# Patient Record
Sex: Male | Born: 1940 | Race: White | Hispanic: No | Marital: Married | Smoking: Never smoker
Health system: Southern US, Community
[De-identification: ages and names within clinical notes are randomized; demographics above are authoritative.]

## PROBLEM LIST (undated history)

## (undated) DIAGNOSIS — G5602 Carpal tunnel syndrome, left upper limb: Secondary | ICD-10-CM

## (undated) DIAGNOSIS — R351 Nocturia: Secondary | ICD-10-CM

## (undated) DIAGNOSIS — R42 Dizziness and giddiness: Secondary | ICD-10-CM

## (undated) DIAGNOSIS — I10 Essential (primary) hypertension: Secondary | ICD-10-CM

## (undated) DIAGNOSIS — M1990 Unspecified osteoarthritis, unspecified site: Secondary | ICD-10-CM

## (undated) DIAGNOSIS — Z22322 Carrier or suspected carrier of Methicillin resistant Staphylococcus aureus: Secondary | ICD-10-CM

## (undated) DIAGNOSIS — M545 Low back pain, unspecified: Secondary | ICD-10-CM

## (undated) DIAGNOSIS — G629 Polyneuropathy, unspecified: Secondary | ICD-10-CM

## (undated) HISTORY — PX: TONSILLECTOMY: SUR1361

## (undated) HISTORY — PX: JOINT REPLACEMENT: SHX530

---
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
## 2009-01-21 DEATH — deceased

## 2010-02-21 HISTORY — PX: OTHER SURGICAL HISTORY: SHX169

## 2012-01-31 ENCOUNTER — Other Ambulatory Visit (HOSPITAL_COMMUNITY): Payer: Self-pay | Admitting: Orthopaedic Surgery

## 2012-02-01 ENCOUNTER — Encounter (HOSPITAL_COMMUNITY): Payer: Self-pay | Admitting: Pharmacy Technician

## 2012-02-07 ENCOUNTER — Encounter (HOSPITAL_COMMUNITY)
Admission: RE | Admit: 2012-02-07 | Discharge: 2012-02-07 | Disposition: A | Discharge status: Home / Self Care | Payer: Medicare Other | Source: Ambulatory Visit | Attending: Orthopaedic Surgery | Admitting: Orthopaedic Surgery

## 2012-02-07 ENCOUNTER — Ambulatory Visit (HOSPITAL_COMMUNITY)
Admission: RE | Admit: 2012-02-07 | Discharge: 2012-02-07 | Disposition: A | Discharge status: Home / Self Care | Payer: Medicare Other | Source: Ambulatory Visit | Attending: Orthopaedic Surgery | Admitting: Orthopaedic Surgery

## 2012-02-07 ENCOUNTER — Encounter (HOSPITAL_COMMUNITY): Payer: Self-pay

## 2012-02-07 DIAGNOSIS — Z01818 Encounter for other preprocedural examination: Secondary | ICD-10-CM | POA: Insufficient documentation

## 2012-02-07 DIAGNOSIS — Z01812 Encounter for preprocedural laboratory examination: Secondary | ICD-10-CM | POA: Insufficient documentation

## 2012-02-07 DIAGNOSIS — I10 Essential (primary) hypertension: Secondary | ICD-10-CM | POA: Insufficient documentation

## 2012-02-07 HISTORY — DX: Essential (primary) hypertension: I10

## 2012-02-07 HISTORY — DX: Unspecified osteoarthritis, unspecified site: M19.90

## 2012-02-07 HISTORY — DX: Nocturia: R35.1

## 2012-02-07 LAB — CBC
HCT: 41.5 % (ref 39.0–52.0)
MCH: 33.4 pg (ref 26.0–34.0)
MCV: 97 fL (ref 78.0–100.0)
Platelets: 231 10*3/uL (ref 150–400)
RBC: 4.28 MIL/uL (ref 4.22–5.81)
WBC: 7.4 10*3/uL (ref 4.0–10.5)

## 2012-02-07 LAB — SURGICAL PCR SCREEN
MRSA, PCR: NEGATIVE
Staphylococcus aureus: NEGATIVE

## 2012-02-07 LAB — URINALYSIS, ROUTINE W REFLEX MICROSCOPIC
Bilirubin Urine: NEGATIVE
Hgb urine dipstick: NEGATIVE
Ketones, ur: NEGATIVE mg/dL
Nitrite: NEGATIVE
Specific Gravity, Urine: 1.025 (ref 1.005–1.030)
pH: 5.5 (ref 5.0–8.0)

## 2012-02-07 LAB — BASIC METABOLIC PANEL
CO2: 28 mEq/L (ref 19–32)
Calcium: 9.7 mg/dL (ref 8.4–10.5)
Chloride: 99 mEq/L (ref 96–112)
Creatinine, Ser: 0.94 mg/dL (ref 0.50–1.35)
Glucose, Bld: 95 mg/dL (ref 70–99)
Sodium: 136 mEq/L (ref 135–145)

## 2012-02-07 NOTE — Progress Notes (Signed)
02/07/12 1100  OBSTRUCTIVE SLEEP APNEA  Have you ever been diagnosed with sleep apnea through a sleep study? No  Do you snore loudly (loud enough to be heard through closed doors)?  1  Do you often feel tired, fatigued, or sleepy during the daytime? 1  Has anyone observed you stop breathing during your sleep? 1  Do you have, or are you being treated for high blood pressure? 1  BMI more than 35 kg/m2? 1  Age over 70 years old? 1  Neck circumference greater than 40 cm/18 inches? 1  Gender: 1  Obstructive Sleep Apnea Score 8   Score 4 or greater  Results sent to PCP

## 2012-02-07 NOTE — Pre-Procedure Instructions (Signed)
PREOP CBC, BMET, PT, PTT, UA, EKG, CXR WERE DONE TODAY AT [facility] AS PER ORDERS DR. Maureen Ralphs AND ANESTHESIOLOGIST'S GUIDELINES.  T/S TO BE DONE DAY OF SURGERY.

## 2012-02-07 NOTE — Patient Instructions (Addendum)
YOUR SURGERY IS SCHEDULED AT [facility]  ON:  Thursday  12/26  REPORT TO [place] [facility] AT: 11:00 AM      PHONE # FOR SHORT STAY IS [phone]  DO NOT EAT ANYTHING AFTER MIDNIGHT THE NIGHT BEFORE YOUR SURGERY.   NO FOOD, NO CHEWING GUM, NO MINTS, NO CANDIES, NO CHEWING TOBACCO. YOU MAY HAVE CLEAR LIQUIDS TO DRINK FROM MIDNIGHT THE NIGHT BEFORE SURGERY - UNTIL 7:00 AM DAY OF SURGERY - LIKE WATER, BLACK COFFEE - NO MILK OR MILK PRODUCTS.    NOTHING TO DRINK AFTER 7:00 AM DAY OF SURGERY!!!!  PLEASE TAKE THE FOLLOWING MEDICATIONS THE AM OF YOUR SURGERY WITH A FEW SIPS OF WATER:  CARDIZEM  IF YOU USE INHALERS--USE YOUR INHALERS THE AM OF YOUR SURGERY AND BRING INHALERS TO THE HOSPITAL -TAKE TO SURGERY.    IF YOU ARE DIABETIC:  DO NOT TAKE ANY DIABETIC MEDICATIONS THE AM OF YOUR SURGERY.  IF YOU TAKE INSULIN IN THE EVENINGS--PLEASE ONLY TAKE 1/2 NORMAL EVENING DOSE THE NIGHT BEFORE YOUR SURGERY.  NO INSULIN THE AM OF YOUR SURGERY.  IF YOU HAVE SLEEP APNEA AND USE CPAP OR BIPAP--PLEASE BRING THE MASK AND THE TUBING.  DO NOT BRING YOUR MACHINE.  DO NOT BRING VALUABLES, MONEY, CREDIT CARDS.  DO NOT WEAR JEWELRY, MAKE-UP, NAIL POLISH AND NO METAL PINS OR CLIPS IN YOUR HAIR. CONTACT LENS, DENTURES / PARTIALS, GLASSES SHOULD NOT BE WORN TO SURGERY AND IN MOST CASES-HEARING AIDS WILL NEED TO BE REMOVED.  BRING YOUR GLASSES CASE, ANY EQUIPMENT NEEDED FOR YOUR CONTACT LENS. FOR PATIENTS ADMITTED TO THE HOSPITAL--CHECK OUT TIME THE DAY OF DISCHARGE IS 11:00 AM.  ALL INPATIENT ROOMS ARE PRIVATE - WITH BATHROOM, TELEPHONE, TELEVISION AND WIFI INTERNET.  IF YOU ARE BEING DISCHARGED THE SAME DAY OF YOUR SURGERY--YOU CAN NOT DRIVE YOURSELF HOME--AND SHOULD NOT GO HOME ALONE BY TAXI OR BUS.  NO DRIVING OR OPERATING MACHINERY FOR 24 HOURS FOLLOWING ANESTHESIA / PAIN MEDICATIONS.  PLEASE MAKE ARRANGEMENTS FOR SOMEONE TO BE WITH YOU AT HOME THE FIRST 24 HOURS AFTER SURGERY. RESPONSIBLE  DRIVER'S NAME___________________________                                               PHONE #   _______________________                               PLEASE READ OVER ANY  FACT SHEETS THAT YOU WERE GIVEN: MRSA INFORMATION, BLOOD TRANSFUSION INFORMATION, INCENTIVE SPIROMETER INFORMATION. FAILURE TO FOLLOW THESE INSTRUCTIONS MAY RESULT IN THE CANCELLATION OF YOUR SURGERY.   PATIENT SIGNATURE_________________________________

## 2012-02-16 ENCOUNTER — Inpatient Hospital Stay (HOSPITAL_COMMUNITY): Payer: Medicare Other

## 2012-02-16 ENCOUNTER — Inpatient Hospital Stay (HOSPITAL_COMMUNITY)
Admission: RE | Admit: 2012-02-16 | Discharge: 2012-02-18 | DRG: 470 | Disposition: A | Discharge status: Home Health | Payer: Medicare Other | Source: Ambulatory Visit | Attending: Orthopaedic Surgery | Admitting: Orthopaedic Surgery

## 2012-02-16 ENCOUNTER — Encounter (HOSPITAL_COMMUNITY): Payer: Self-pay | Admitting: Certified Registered Nurse Anesthetist

## 2012-02-16 ENCOUNTER — Encounter (HOSPITAL_COMMUNITY): Payer: Self-pay | Admitting: *Deleted

## 2012-02-16 ENCOUNTER — Inpatient Hospital Stay (HOSPITAL_COMMUNITY): Payer: Medicare Other | Admitting: Certified Registered Nurse Anesthetist

## 2012-02-16 ENCOUNTER — Encounter (HOSPITAL_COMMUNITY)
Admission: RE | Disposition: A | Discharge status: Home Health | Payer: Self-pay | Source: Ambulatory Visit | Attending: Orthopaedic Surgery

## 2012-02-16 DIAGNOSIS — I10 Essential (primary) hypertension: Secondary | ICD-10-CM | POA: Diagnosis present

## 2012-02-16 DIAGNOSIS — M1610 Unilateral primary osteoarthritis, unspecified hip: Principal | ICD-10-CM | POA: Diagnosis present

## 2012-02-16 DIAGNOSIS — M169 Osteoarthritis of hip, unspecified: Secondary | ICD-10-CM

## 2012-02-16 HISTORY — PX: TOTAL HIP ARTHROPLASTY: SHX124

## 2012-02-16 LAB — TYPE AND SCREEN: Antibody Screen: NEGATIVE

## 2012-02-16 LAB — ABO/RH: ABO/RH(D): O POS

## 2012-02-16 SURGERY — ARTHROPLASTY, HIP, TOTAL, ANTERIOR APPROACH
Anesthesia: General | Site: Hip | Laterality: Right | Wound class: Clean

## 2012-02-16 MED ORDER — HYDROMORPHONE HCL PF 1 MG/ML IJ SOLN: 1.0000 mg | INTRAMUSCULAR | Status: DC | PRN

## 2012-02-16 MED ORDER — PROPOFOL 10 MG/ML IV BOLUS
INTRAVENOUS | Status: DC | PRN
  Administered 2012-02-16: 180 mg via INTRAVENOUS

## 2012-02-16 MED ORDER — DOCUSATE SODIUM 100 MG PO CAPS
100.0000 mg | ORAL_CAPSULE | Freq: Two times a day (BID) | ORAL | Status: DC
  Administered 2012-02-16 – 2012-02-18 (×4): 100 mg via ORAL

## 2012-02-16 MED ORDER — LACTATED RINGERS IV SOLN
INTRAVENOUS | Status: DC | PRN
  Administered 2012-02-16 (×2): via INTRAVENOUS

## 2012-02-16 MED ORDER — ONDANSETRON HCL 4 MG/2ML IJ SOLN: 4.0000 mg | Freq: Four times a day (QID) | INTRAMUSCULAR | Status: DC | PRN

## 2012-02-16 MED ORDER — FENTANYL CITRATE 0.05 MG/ML IJ SOLN
INTRAMUSCULAR | Status: DC | PRN
  Administered 2012-02-16: 100 ug via INTRAVENOUS
  Administered 2012-02-16: 50 ug via INTRAVENOUS
  Administered 2012-02-16: 100 ug via INTRAVENOUS

## 2012-02-16 MED ORDER — PHENOL 1.4 % MT LIQD
1.0000 | OROMUCOSAL | Status: DC | PRN
  Filled 2012-02-16: qty 177

## 2012-02-16 MED ORDER — CEFAZOLIN SODIUM 1-5 GM-% IV SOLN
1.0000 g | Freq: Four times a day (QID) | INTRAVENOUS | Status: AC
  Administered 2012-02-17: 1 g via INTRAVENOUS
  Filled 2012-02-16 (×5): qty 50

## 2012-02-16 MED ORDER — ZOLPIDEM TARTRATE 5 MG PO TABS: 5.0000 mg | ORAL_TABLET | Freq: Every evening | ORAL | Status: DC | PRN

## 2012-02-16 MED ORDER — DILTIAZEM HCL ER 240 MG PO CP24
240.0000 mg | ORAL_CAPSULE | Freq: Every day | ORAL | Status: DC
  Filled 2012-02-16: qty 1

## 2012-02-16 MED ORDER — ACETAMINOPHEN 325 MG PO TABS: 650.0000 mg | ORAL_TABLET | Freq: Four times a day (QID) | ORAL | Status: DC | PRN

## 2012-02-16 MED ORDER — ASPIRIN EC 325 MG PO TBEC
325.0000 mg | DELAYED_RELEASE_TABLET | Freq: Two times a day (BID) | ORAL | Status: DC
  Administered 2012-02-17 – 2012-02-18 (×3): 325 mg via ORAL
  Filled 2012-02-16 (×5): qty 1

## 2012-02-16 MED ORDER — METHOCARBAMOL 100 MG/ML IJ SOLN
500.0000 mg | Freq: Four times a day (QID) | INTRAVENOUS | Status: DC | PRN
  Administered 2012-02-16: 500 mg via INTRAVENOUS
  Filled 2012-02-16: qty 5

## 2012-02-16 MED ORDER — DEXTROSE 5 % IV SOLN
3.0000 g | INTRAVENOUS | Status: AC
  Administered 2012-02-16: 3 g via INTRAVENOUS
  Filled 2012-02-16: qty 3000

## 2012-02-16 MED ORDER — CO Q 10 100 MG PO CAPS: 1.0000 | ORAL_CAPSULE | Freq: Every day | ORAL | Status: DC

## 2012-02-16 MED ORDER — METHOCARBAMOL 500 MG PO TABS
500.0000 mg | ORAL_TABLET | Freq: Four times a day (QID) | ORAL | Status: DC | PRN
  Administered 2012-02-17: 500 mg via ORAL
  Filled 2012-02-16: qty 1

## 2012-02-16 MED ORDER — ATORVASTATIN CALCIUM 10 MG PO TABS
10.0000 mg | ORAL_TABLET | Freq: Every day | ORAL | Status: DC
  Administered 2012-02-16 – 2012-02-17 (×2): 10 mg via ORAL
  Filled 2012-02-16 (×3): qty 1

## 2012-02-16 MED ORDER — SODIUM CHLORIDE 0.9 % IV SOLN
INTRAVENOUS | Status: DC
  Administered 2012-02-16 – 2012-02-17 (×3): via INTRAVENOUS

## 2012-02-16 MED ORDER — NEOSTIGMINE METHYLSULFATE 1 MG/ML IJ SOLN
INTRAMUSCULAR | Status: DC | PRN
  Administered 2012-02-16: 4 mg via INTRAVENOUS

## 2012-02-16 MED ORDER — DIPHENHYDRAMINE HCL 12.5 MG/5ML PO ELIX: 12.5000 mg | ORAL_SOLUTION | ORAL | Status: DC | PRN

## 2012-02-16 MED ORDER — FERROUS SULFATE 325 (65 FE) MG PO TABS
325.0000 mg | ORAL_TABLET | Freq: Three times a day (TID) | ORAL | Status: DC
  Administered 2012-02-17 – 2012-02-18 (×4): 325 mg via ORAL
  Filled 2012-02-16 (×7): qty 1

## 2012-02-16 MED ORDER — GLYCOPYRROLATE 0.2 MG/ML IJ SOLN
INTRAMUSCULAR | Status: DC | PRN
  Administered 2012-02-16: .6 mg via INTRAVENOUS

## 2012-02-16 MED ORDER — SUCCINYLCHOLINE CHLORIDE 20 MG/ML IJ SOLN
INTRAMUSCULAR | Status: DC | PRN
  Administered 2012-02-16: 100 mg via INTRAVENOUS

## 2012-02-16 MED ORDER — ONDANSETRON HCL 4 MG/2ML IJ SOLN
INTRAMUSCULAR | Status: DC | PRN
  Administered 2012-02-16: 4 mg via INTRAVENOUS

## 2012-02-16 MED ORDER — VITAMIN B-12 1000 MCG PO TABS
1000.0000 ug | ORAL_TABLET | Freq: Every day | ORAL | Status: DC
  Administered 2012-02-17 – 2012-02-18 (×2): 1000 ug via ORAL
  Filled 2012-02-16 (×2): qty 1

## 2012-02-16 MED ORDER — ACETAMINOPHEN 650 MG RE SUPP: 650.0000 mg | Freq: Four times a day (QID) | RECTAL | Status: DC | PRN

## 2012-02-16 MED ORDER — KETOROLAC TROMETHAMINE 15 MG/ML IJ SOLN
7.5000 mg | Freq: Four times a day (QID) | INTRAMUSCULAR | Status: AC
  Administered 2012-02-16 – 2012-02-17 (×4): 7.5 mg via INTRAVENOUS
  Filled 2012-02-16 (×4): qty 1

## 2012-02-16 MED ORDER — ONDANSETRON HCL 4 MG PO TABS: 4.0000 mg | ORAL_TABLET | Freq: Four times a day (QID) | ORAL | Status: DC | PRN

## 2012-02-16 MED ORDER — METOCLOPRAMIDE HCL 5 MG/ML IJ SOLN: 5.0000 mg | Freq: Three times a day (TID) | INTRAMUSCULAR | Status: DC | PRN

## 2012-02-16 MED ORDER — OXYCODONE HCL 5 MG PO TABS: 5.0000 mg | ORAL_TABLET | ORAL | Status: DC | PRN

## 2012-02-16 MED ORDER — MENTHOL 3 MG MT LOZG
1.0000 | LOZENGE | OROMUCOSAL | Status: DC | PRN
  Filled 2012-02-16: qty 9

## 2012-02-16 MED ORDER — OXYCODONE HCL ER 20 MG PO T12A
20.0000 mg | EXTENDED_RELEASE_TABLET | Freq: Two times a day (BID) | ORAL | Status: DC
  Administered 2012-02-16 – 2012-02-18 (×4): 20 mg via ORAL
  Filled 2012-02-16 (×4): qty 1

## 2012-02-16 MED ORDER — DEXAMETHASONE SODIUM PHOSPHATE 10 MG/ML IJ SOLN
INTRAMUSCULAR | Status: DC | PRN
  Administered 2012-02-16: 10 mg via INTRAVENOUS

## 2012-02-16 MED ORDER — HYDROMORPHONE HCL PF 1 MG/ML IJ SOLN
0.2500 mg | INTRAMUSCULAR | Status: DC | PRN
  Administered 2012-02-16 (×4): 0.5 mg via INTRAVENOUS

## 2012-02-16 MED ORDER — MIDAZOLAM HCL 5 MG/5ML IJ SOLN
INTRAMUSCULAR | Status: DC | PRN
  Administered 2012-02-16: 2 mg via INTRAVENOUS

## 2012-02-16 MED ORDER — METOPROLOL SUCCINATE ER 100 MG PO TB24
200.0000 mg | ORAL_TABLET | Freq: Every day | ORAL | Status: DC
  Administered 2012-02-16 – 2012-02-17 (×2): 200 mg via ORAL
  Filled 2012-02-16 (×3): qty 2

## 2012-02-16 MED ORDER — DOXAZOSIN MESYLATE 8 MG PO TABS
8.0000 mg | ORAL_TABLET | Freq: Every day | ORAL | Status: DC
  Administered 2012-02-16 – 2012-02-17 (×2): 8 mg via ORAL
  Filled 2012-02-16 (×3): qty 1

## 2012-02-16 MED ORDER — ALUM & MAG HYDROXIDE-SIMETH 200-200-20 MG/5ML PO SUSP
30.0000 mL | ORAL | Status: DC | PRN
  Administered 2012-02-17: 30 mL via ORAL
  Filled 2012-02-16: qty 30

## 2012-02-16 MED ORDER — DILTIAZEM HCL ER COATED BEADS 240 MG PO CP24
240.0000 mg | ORAL_CAPSULE | Freq: Every morning | ORAL | Status: DC
  Administered 2012-02-17 – 2012-02-18 (×2): 240 mg via ORAL
  Filled 2012-02-16 (×2): qty 1

## 2012-02-16 MED ORDER — MSM 1000 MG PO CAPS: 1.0000 | ORAL_CAPSULE | Freq: Every day | ORAL | Status: DC

## 2012-02-16 MED ORDER — METOPROLOL SUCCINATE ER 100 MG PO TB24: 100.0000 mg | ORAL_TABLET | Freq: Every day | ORAL | Status: DC

## 2012-02-16 MED ORDER — ACETAMINOPHEN 10 MG/ML IV SOLN
INTRAVENOUS | Status: DC | PRN
  Administered 2012-02-16: 1000 mg via INTRAVENOUS

## 2012-02-16 MED ORDER — EPHEDRINE SULFATE 50 MG/ML IJ SOLN
INTRAMUSCULAR | Status: DC | PRN
  Administered 2012-02-16: 10 mg via INTRAVENOUS

## 2012-02-16 MED ORDER — LACTATED RINGERS IV SOLN: INTRAVENOUS | Status: DC

## 2012-02-16 MED ORDER — 0.9 % SODIUM CHLORIDE (POUR BTL) OPTIME
TOPICAL | Status: DC | PRN
  Administered 2012-02-16: 1000 mL

## 2012-02-16 MED ORDER — ROCURONIUM BROMIDE 100 MG/10ML IV SOLN
INTRAVENOUS | Status: DC | PRN
  Administered 2012-02-16: 20 mg via INTRAVENOUS
  Administered 2012-02-16: 40 mg via INTRAVENOUS
  Administered 2012-02-16: 10 mg via INTRAVENOUS

## 2012-02-16 MED ORDER — METOCLOPRAMIDE HCL 10 MG PO TABS: 5.0000 mg | ORAL_TABLET | Freq: Three times a day (TID) | ORAL | Status: DC | PRN

## 2012-02-16 SURGICAL SUPPLY — 38 items
BAG ZIPLOCK 12X15 (MISCELLANEOUS) ×4 IMPLANT
BLADE SAW SGTL 18X1.27X75 (BLADE) ×2 IMPLANT
CELLS DAT CNTRL 66122 CELL SVR (MISCELLANEOUS) ×1 IMPLANT
CLOTH BEACON ORANGE TIMEOUT ST (SAFETY) ×2 IMPLANT
DRAPE C-ARM 42X72 X-RAY (DRAPES) ×2 IMPLANT
DRAPE STERI IOBAN 125X83 (DRAPES) ×2 IMPLANT
DRAPE SURG 17X11 SM STRL (DRAPES) ×8 IMPLANT
DRAPE U-SHAPE 47X51 STRL (DRAPES) IMPLANT
DRSG MEPILEX BORDER 4X8 (GAUZE/BANDAGES/DRESSINGS) ×2 IMPLANT
DRSG MEPITEL 8X12 (GAUZE/BANDAGES/DRESSINGS) ×2 IMPLANT
DRSG XEROFORM 1X8 (GAUZE/BANDAGES/DRESSINGS) ×2 IMPLANT
DURAPREP 26ML APPLICATOR (WOUND CARE) ×2 IMPLANT
ELECT BLADE TIP CTD 4 INCH (ELECTRODE) ×2 IMPLANT
ELECT REM PT RETURN 9FT ADLT (ELECTROSURGICAL) ×2
ELECTRODE REM PT RTRN 9FT ADLT (ELECTROSURGICAL) ×1 IMPLANT
FACESHIELD LNG OPTICON STERILE (SAFETY) ×8 IMPLANT
GAUZE XEROFORM 1X8 LF (GAUZE/BANDAGES/DRESSINGS) ×2 IMPLANT
GLOVE BIO SURGEON STRL SZ7 (GLOVE) ×2 IMPLANT
GLOVE BIO SURGEON STRL SZ7.5 (GLOVE) ×2 IMPLANT
GLOVE BIOGEL PI IND STRL 7.5 (GLOVE) IMPLANT
GLOVE BIOGEL PI IND STRL 8 (GLOVE) ×1 IMPLANT
GLOVE BIOGEL PI INDICATOR 7.5 (GLOVE)
GLOVE BIOGEL PI INDICATOR 8 (GLOVE) ×1
GLOVE ECLIPSE 7.0 STRL STRAW (GLOVE) ×2 IMPLANT
GOWN STRL REIN XL XLG (GOWN DISPOSABLE) ×4 IMPLANT
KIT BASIN OR (CUSTOM PROCEDURE TRAY) ×2 IMPLANT
PACK TOTAL JOINT (CUSTOM PROCEDURE TRAY) ×2 IMPLANT
PADDING CAST COTTON 6X4 STRL (CAST SUPPLIES) ×2 IMPLANT
RTRCTR WOUND ALEXIS 18CM MED (MISCELLANEOUS) ×2
STAPLER VISISTAT 35W (STAPLE) IMPLANT
SUT ETHIBOND NAB CT1 #1 30IN (SUTURE) ×4 IMPLANT
SUT VIC AB 1 CT1 36 (SUTURE) ×4 IMPLANT
SUT VIC AB 2-0 CT1 27 (SUTURE)
SUT VIC AB 2-0 CT1 TAPERPNT 27 (SUTURE) IMPLANT
SUT VLOC 180 0 24IN GS25 (SUTURE) ×2 IMPLANT
TOWEL OR 17X26 10 PK STRL BLUE (TOWEL DISPOSABLE) ×4 IMPLANT
TOWEL OR NON WOVEN STRL DISP B (DISPOSABLE) ×2 IMPLANT
TRAY FOLEY CATH 14FRSI W/METER (CATHETERS) ×2 IMPLANT

## 2012-02-16 NOTE — Anesthesia Postprocedure Evaluation (Signed)
  Anesthesia Post-op Note  Patient: Bruce Morgan  Procedure(s) Performed: Procedure(s) (LRB): TOTAL HIP ARTHROPLASTY ANTERIOR APPROACH (Right)  Patient Location: PACU  Anesthesia Type: General  Level of Consciousness: awake and alert   Airway and Oxygen Therapy: Patient Spontanous Breathing  Post-op Pain: mild  Post-op Assessment: Post-op Vital signs reviewed, Patient's Cardiovascular Status Stable, Respiratory Function Stable, Patent Airway and No signs of Nausea or vomiting  Last Vitals:  Filed Vitals:   02/16/12 1515  BP: 124/64  Pulse: 61  Temp:   Resp: 16    Post-op Vital Signs: stable   Complications: No apparent anesthesia complications

## 2012-02-16 NOTE — Anesthesia Postprocedure Evaluation (Signed)
  Anesthesia Post-op Note  Patient: Bruce Morgan  Procedure(s) Performed: Procedure(s) (LRB): TOTAL HIP ARTHROPLASTY ANTERIOR APPROACH (Right)  Patient Location: PACU  Anesthesia Type: General  Level of Consciousness: awake and alert   Airway and Oxygen Therapy: Patient Spontanous Breathing  Post-op Pain: mild  Post-op Assessment: Post-op Vital signs reviewed, Patient's Cardiovascular Status Stable, Respiratory Function Stable, Patent Airway and No signs of Nausea or vomiting  Last Vitals:  Filed Vitals:   02/16/12 1624  BP: 128/71  Pulse: 59  Temp: 36.8 C  Resp: 15    Post-op Vital Signs: stable   Complications: No apparent anesthesia complications

## 2012-02-16 NOTE — Progress Notes (Signed)
Utilization review completed.  

## 2012-02-16 NOTE — Brief Op Note (Signed)
02/16/2012  3:03 PM  PATIENT:  Bruce Morgan  71 y.o. male  PRE-OPERATIVE DIAGNOSIS:  Right hip osteoarthritis  POST-OPERATIVE DIAGNOSIS:  Right hip osteoarthritis  PROCEDURE:  Procedure(s) (LRB) with comments: TOTAL HIP ARTHROPLASTY ANTERIOR APPROACH (Right) - Right Total Hip Arthroplasty  SURGEON:  Surgeon(s) and Role:    * Kathryne Hitch, MD - Primary  PHYSICIAN ASSISTANT:   ASSISTANTS: none   ANESTHESIA:   general  EBL:  Total I/O In: 1000 [I.V.:1000] Out: 550 [Urine:200; Blood:350]  BLOOD ADMINISTERED:none  DRAINS: none   LOCAL MEDICATIONS USED:  NONE  SPECIMEN:  No Specimen  DISPOSITION OF SPECIMEN:  N/A  COUNTS:  YES  TOURNIQUET:  * No tourniquets in log *  DICTATION: .Other Dictation: Dictation Number [id number]  PLAN OF CARE: Admit to inpatient   PATIENT DISPOSITION:  PACU - hemodynamically stable.   Delay start of Pharmacological VTE agent (>24hrs) due to surgical blood loss or risk of bleeding: no

## 2012-02-16 NOTE — Anesthesia Preprocedure Evaluation (Signed)
Anesthesia Evaluation  Patient identified by MRN, date of birth, ID band Patient awake    Reviewed: Allergy & Precautions, H&P , NPO status , Patient's Chart, lab work & pertinent test results  Airway Mallampati: II TM Distance: >3 FB Neck ROM: full    Dental No notable dental hx.    Pulmonary neg pulmonary ROS,  breath sounds clear to auscultation  Pulmonary exam normal       Cardiovascular Exercise Tolerance: Good hypertension, Pt. on medications and Pt. on home beta blockers Rhythm:regular Rate:Normal     Neuro/Psych negative neurological ROS  negative psych ROS   GI/Hepatic negative GI ROS, Neg liver ROS,   Endo/Other  negative endocrine ROS  Renal/GU negative Renal ROS  negative genitourinary   Musculoskeletal   Abdominal   Peds  Hematology negative hematology ROS (+)   Anesthesia Other Findings   Reproductive/Obstetrics negative OB ROS                           Anesthesia Physical Anesthesia Plan  ASA: II  Anesthesia Plan: General   Post-op Pain Management:    Induction: Intravenous  Airway Management Planned: Oral ETT  Additional Equipment:   Intra-op Plan:   Post-operative Plan: Extubation in OR  Informed Consent: I have reviewed the patients History and Physical, chart, labs and discussed the procedure including the risks, benefits and alternatives for the proposed anesthesia with the patient or authorized representative who has indicated his/her understanding and acceptance.   Dental Advisory Given  Plan Discussed with: CRNA and Surgeon  Anesthesia Plan Comments:         Anesthesia Quick Evaluation

## 2012-02-16 NOTE — H&P (Signed)
TOTAL HIP ADMISSION H&P  Patient is admitted for right total hip arthroplasty.  Subjective:  Chief Complaint: right hip pain  HPI: Bruce Morgan, 71 y.o. male, has a history of pain and functional disability in the right hip(s) due to arthritis and patient has failed non-surgical conservative treatments for greater than 12 weeks to include NSAID's and/or analgesics, use of assistive devices, weight reduction as appropriate and activity modification.  Onset of symptoms was gradual starting 4 years ago with gradually worsening course since that time.The patient noted no past surgery on the right hip(s).  Patient currently rates pain in the right hip at 6 out of 10 with activity. Patient has night pain, worsening of pain with activity and weight bearing, pain that interfers with activities of daily living and pain with passive range of motion. Patient has evidence of subchondral cysts, subchondral sclerosis, periarticular osteophytes and joint space narrowing by imaging studies. This condition presents safety issues increasing the risk of falls.  There is no current active infection.  Patient Active Problem List   Diagnosis Date Noted  . Degenerative arthritis of hip 02/16/2012   Past Medical History  Diagnosis Date  . Hypertension   . Nocturia     3 TO 4 TIMES EVERY NIGHT  . Arthritis     Past Surgical History  Procedure Date  . Teeth extractions / permanent dental implants 2012  . Tonsillectomy     AS A CHILD    Prescriptions prior to admission  Medication Sig Dispense Refill  . atorvastatin (LIPITOR) 20 MG tablet Take 10 mg by mouth at bedtime.      Marland Kitchen diltiazem (CARDIZEM CD) 240 MG 24 hr capsule Take 240 mg by mouth every morning.      . diltiazem (DILACOR XR) 240 MG 24 hr capsule Take 240 mg by mouth daily. TAKES IN AM      . doxazosin (CARDURA) 8 MG tablet Take 8 mg by mouth at bedtime.      . metoprolol (TOPROL-XL) 200 MG 24 hr tablet Take 100 mg by mouth at bedtime.      Marland Kitchen  aspirin EC 81 MG tablet Take 81 mg by mouth at bedtime.      . Coenzyme Q10 (CO Q 10) 100 MG CAPS Take 1 capsule by mouth daily.      . Methylsulfonylmethane (MSM) 1000 MG CAPS Take 1 capsule by mouth daily.      . naproxen sodium (ANAPROX) 220 MG tablet Take 220 mg by mouth. TAKES 2 EVERY AM      . OVER THE COUNTER MEDICATION Take 1 capsule by mouth daily. Omega 3 krill oil  daily      . OVER THE COUNTER MEDICATION Reumofan Plus  One daily      . Saw Palmetto 1000 MG CAPS Take by mouth. TAKES 2 EVERY AM      . vitamin B-12 (CYANOCOBALAMIN) 1000 MCG tablet Take 1,000 mcg by mouth daily.       No Known Allergies  History  Substance Use Topics  . Smoking status: Never Smoker   . Smokeless tobacco: Never Used  . Alcohol Use: No    History reviewed. No pertinent family history.   Review of Systems  Musculoskeletal: Positive for joint pain.  All other systems reviewed and are negative.    Objective:  Physical Exam  Constitutional: He is oriented to person, place, and time. He appears well-developed and well-nourished.  HENT:  Head: Normocephalic and atraumatic.  Eyes: Pupils are  equal, round, and reactive to light.  Neck: Normal range of motion. Neck supple.  Cardiovascular: Normal rate and regular rhythm.   Respiratory: Effort normal and breath sounds normal.  GI: Soft. Bowel sounds are normal.  Musculoskeletal:       Right hip: He exhibits decreased range of motion, decreased strength, bony tenderness and crepitus.  Neurological: He is alert and oriented to person, place, and time.  Skin: Skin is warm and dry.  Psychiatric: He has a normal mood and affect.    Vital signs in last 24 hours: Temp:  [98 F (36.7 C)] 98 F (36.7 C) (12/26 1040) Pulse Rate:  [65] 65  (12/26 1040) Resp:  [18] 18  (12/26 1040) BP: (156)/(90) 156/90 mmHg (12/26 1040) SpO2:  [97 %] 97 % (12/26 1040)  Labs:   There is no height or weight on file to calculate BMI.   Imaging Review Plain  radiographs demonstrate moderate degenerative joint disease of the right hip(s). The bone quality appears to be excellent for age and reported activity level.  Assessment/Plan:  End stage arthritis, right hip(s)  The patient history, physical examination, clinical judgement of the provider and imaging studies are consistent with end stage degenerative joint disease of the right hip(s) and total hip arthroplasty is deemed medically necessary. The treatment options including medical management, injection therapy, arthroscopy and arthroplasty were discussed at length. The risks and benefits of total hip arthroplasty were presented and reviewed. The risks due to aseptic loosening, infection, stiffness, dislocation/subluxation,  thromboembolic complications and other imponderables were discussed.  The patient acknowledged the explanation, agreed to proceed with the plan and consent was signed. Patient is being admitted for inpatient treatment for surgery, pain control, PT, OT, prophylactic antibiotics, VTE prophylaxis, progressive ambulation and ADL's and discharge planning.The patient is planning to be discharged home with home health services

## 2012-02-16 NOTE — Transfer of Care (Signed)
Immediate Anesthesia Transfer of Care Note  Patient: Bruce Morgan  Procedure(s) Performed: Procedure(s) (LRB) with comments: TOTAL HIP ARTHROPLASTY ANTERIOR APPROACH (Right) - Right Total Hip Arthroplasty  Patient Location: PACU  Anesthesia Type:General  Level of Consciousness: awake, alert , oriented and patient cooperative  Airway & Oxygen Therapy: Patient Spontanous Breathing and Patient connected to face mask oxygen  Post-op Assessment: Report given to PACU RN and Post -op Vital signs reviewed and stable  Post vital signs: Reviewed and stable  Complications: No apparent anesthesia complications

## 2012-02-17 ENCOUNTER — Encounter (HOSPITAL_COMMUNITY): Payer: Self-pay | Admitting: Orthopaedic Surgery

## 2012-02-17 LAB — BASIC METABOLIC PANEL
BUN: 12 mg/dL (ref 6–23)
CO2: 27 mEq/L (ref 19–32)
Calcium: 9.3 mg/dL (ref 8.4–10.5)
GFR calc non Af Amer: 84 mL/min — ABNORMAL LOW (ref >90)
Glucose, Bld: 153 mg/dL — ABNORMAL HIGH (ref 70–99)

## 2012-02-17 LAB — CBC
HCT: 37.1 % — ABNORMAL LOW (ref 39.0–52.0)
Hemoglobin: 12.9 g/dL — ABNORMAL LOW (ref 13.0–17.0)
MCH: 33.9 pg (ref 26.0–34.0)
MCHC: 34.8 g/dL (ref 30.0–36.0)
MCV: 97.4 fL (ref 78.0–100.0)

## 2012-02-17 NOTE — Care Management Note (Signed)
    Page 1 of 2   02/17/2012     5:49:17 PM   CARE MANAGEMENT NOTE 02/17/2012  Patient:  Bruce Morgan, Bruce Morgan   Account Number:  [id number]  Date Initiated:  02/17/2012  Documentation initiated by:  Colleen Can  Subjective/Objective Assessment:   dx rt hip osteoarethritis and DJD: total hip replacemnt-anterior approach     Action/Plan:   CM spoke with patient. Plans are for patient to return to his home in [place], [place] where spouse will be caregiver. incaregiver.   Anticipated DC Date:  02/07/2012   Anticipated DC Plan:  HOME W HOME HEALTH SERVICES  In-house referral  NA      DC Planning Services  CM consult      [facility] Choice  HOME HEALTH   Choice offered to / List presented to:  [name]        HH arranged  HH-2 PT      [facility] agency  [facility]   Status of service:  Completed, signed off Medicare Important Message given?  NA - LOS <3 / Initial given by admissions (If response is "NO", the following Medicare IM given date fields will be blank) Date Medicare IM given:   Date Additional Medicare IM given:    Discharge Disposition:  HOME W HOME HEALTH SERVICES  Per UR Regulation:    If discussed at Long Length of Stay Meetings, dates discussed:    Comments:  02/17/2012 Damaris Schooner RN CCM [phone] Pt will need RW and #N!Marland [facility] notified of DME need. Aundra Millet will provide [facility] services with start date day after discharge.

## 2012-02-17 NOTE — Progress Notes (Signed)
Physical Therapy Treatment Patient Details Name: Bruce Morgan MRN: [id number] DOB: [birth year] Today's Date: 02/17/2012 Time: 6578-4696 PT Time Calculation (min): 24 min  PT Assessment / Plan / Recommendation Comments on Treatment Session  POD # 1 R Direct Anterior Approach THR pm session.  Pt sitting in recliner feeling good.  Minor c/o hickups and ABD distention. Amb pt in hallway with min c/o "soreness". Spouse present and in room. Addressed questions about use of ICE, safety with turns, safety using hands esp with stand to sit to control decend. Pt plans to D/C to home tomorrow.    Follow Up Recommendations  Home health PT     Does the patient have the potential to tolerate intense rehabilitation     Barriers to Discharge        Equipment Recommendations  Rolling walker with 5" wheels    Recommendations for Other Services OT consult  Frequency 7X/week   Plan Discharge plan remains appropriate    Precautions / Restrictions Precautions Precautions: None Precaution Comments: Direct Anterior Approach Restrictions Weight Bearing Restrictions: No Other Position/Activity Restrictions: WBAT   Pertinent Vitals/Pain C/o "soreness"    Mobility  Bed Mobility Bed Mobility: Not assessed Details for Bed Mobility Assistance: Pt OOB in recliner Transfers Transfers: Sit to Stand;Stand to Sit Sit to Stand: 4: Min guard;From chair/3-in-1 Stand to Sit: 4: Min guard;To chair/3-in-1 Details for Transfer Assistance: <25% VC's on proper tech and hand placement Ambulation/Gait Ambulation/Gait Assistance: 4: Min guard Ambulation Distance (Feet): 110 Feet Assistive device: Rolling walker Ambulation/Gait Assistance Details: <25% VC's on proper sequencing and upright posture.  Good alternating gait. 50% VC's on backward gait sequencing when going to recliner. Gait Pattern: Step-to pattern;Step-through pattern Gait velocity: WFL     PT Goals                                                  progressing    Visit Information  Last PT Received On: 02/17/12    Subjective Data  Subjective: I feel pretty good Patient Stated Goal: n/a   Cognition    good   Balance   fair+  with RW  End of Session PT - End of Session Equipment Utilized During Treatment: Gait belt Activity Tolerance: Patient tolerated treatment well Patient left: in chair;with call bell/phone within reach;with family/visitor present;Other (comment) (ICE to R hip)   Felecia Shelling  PTA [facility]  Acute  Rehab Pager     [phone]

## 2012-02-17 NOTE — Evaluation (Signed)
Occupational Therapy Evaluation Patient Details Name: Bruce Morgan MRN: [id number] DOB: 1940/09/14 Today's Date: 02/17/2012 Time: 4098-1191 OT Time Calculation (min): 22 min  OT Assessment / Plan / Recommendation Clinical Impression  This 71 year old male was admitted with R anterior direct approach THA.  All education was completed.  Pt will benefit from 3:1 but no further OT is needed at this time.      OT Assessment  Patient does not need any further OT services    Follow Up Recommendations  No OT follow up    Barriers to Discharge      Equipment Recommendations  3 in 1 bedside comode    Recommendations for Other Services    Frequency       Precautions / Restrictions Precautions Precautions: None Restrictions Weight Bearing Restrictions: No   Pertinent Vitals/Pain R hip sore    ADL  Toilet Transfer: Performed;Minimal assistance Toilet Transfer Method: Sit to stand Toilet Transfer Equipment: Comfort height toilet Tub/Shower Transfer: Performed;Min guard Tub/Shower Transfer Method: Science writer: Walk in shower Transfers/Ambulation Related to ADLs: ambulated to bathroom with min guard.   ADL Comments: Wife has been helping with adls.  They ordered a sock aid which pt has been using. Showed AE kit.  Pt has high commode at home:  practiced using walker and toilet seat for support with sit to stand:  min A needed.  Showed 3:1--pt will be more independent with transfers using this.      OT Diagnosis:    OT Problem List:   OT Treatment Interventions:     OT Goals    Visit Information  Last OT Received On: 02/17/12 Assistance Needed: +1    Subjective Data  Subjective: Do you think I'll be able to put my shoes on myself Patient Stated Goal: be independent without AE for adls   Prior Functioning     Home Living Lives With: Spouse Bathroom Shower/Tub: Walk-in Stage manager: Handicapped height (nothing next to it for hand  placement) Additional Comments: has built in shower seat, sock aide and long shoehorn Prior Function Level of Independence: Needs assistance Needs Assistance: Dressing;Bathing Bath: Minimal Dressing: Moderate Communication Communication: No difficulties         Vision/Perception     Cognition  Overall Cognitive Status: Appears within functional limits for tasks assessed/performed Arousal/Alertness: Awake/alert Orientation Level: Oriented X4 / Intact Behavior During Session: [facility] for tasks performed    Extremity/Trunk Assessment Right Upper Extremity Assessment RUE ROM/Strength/Tone: WFL for tasks assessed (has arthritis in bil shoulders) Left Upper Extremity Assessment LUE ROM/Strength/Tone: WFL for tasks assessed     Mobility Transfers Transfers: Sit to Stand Sit to Stand: 4: Min guard     Shoulder Instructions     Exercise     Balance     End of Session OT - End of Session Activity Tolerance: Patient tolerated treatment well Patient left: in chair;with call bell/phone within reach;with family/visitor present  GO     Ahaana Rochette 02/17/2012, 12:07 PM Marica Otter, OTR/L [phone] 02/17/2012

## 2012-02-17 NOTE — Progress Notes (Signed)
Subjective: 1 Day Post-Op Procedure(s) (LRB): TOTAL HIP ARTHROPLASTY ANTERIOR APPROACH (Right) Patient reports pain as moderate.    Objective: Vital signs in last 24 hours: Temp:  [97.5 F (36.4 C)-99.4 F (37.4 C)] 98.3 F (36.8 C) (12/27 1000) Pulse Rate:  [56-76] 65  (12/27 1000) Resp:  [12-20] 16  (12/27 1000) BP: (109-170)/(60-87) 112/60 mmHg (12/27 1000) SpO2:  [2 %-100 %] 2 % (12/27 1000) Weight:  [99.791 kg (220 lb)] 99.791 kg (220 lb) (12/26 1700)  Intake/Output from previous day: 12/26 0701 - 12/27 0700 In: 2623.8 [I.V.:2573.8; IV Piggyback:50] Out: 2000 [Urine:1650; Blood:350] Intake/Output this shift: Total I/O In: -  Out: 100 [Urine:100]   Basename 02/17/12 0444  HGB 12.9*    Basename 02/17/12 0444  WBC 11.3*  RBC 3.81*  HCT 37.1*  PLT 230    Basename 02/17/12 0444  NA 136  K 3.9  CL 99  CO2 27  BUN 12  CREATININE 0.91  GLUCOSE 153*  CALCIUM 9.3   No results found for this basename: LABPT:2,INR:2 in the last 72 hours  Sensation intact distally Intact pulses distally Dorsiflexion/Plantar flexion intact Incision: scant drainage  Assessment/Plan: 1 Day Post-Op Procedure(s) (LRB): TOTAL HIP ARTHROPLASTY ANTERIOR APPROACH (Right) Up with therapy  Bruce Morgan Y 02/17/2012, 11:36 AM

## 2012-02-17 NOTE — Evaluation (Signed)
Physical Therapy Evaluation Patient Details Name: Bruce Morgan MRN: [id number] DOB: 23-Mar-1940 Today's Date: 02/17/2012 Time: 8657-8469 PT Time Calculation (min): 38 min  PT Assessment / Plan / Recommendation Clinical Impression  Pt s/p R THR presents with decreased R LE strength/ROM and post op discomfort limiting functional mobility    PT Assessment  Patient needs continued PT services    Follow Up Recommendations  Home health PT    Does the patient have the potential to tolerate intense rehabilitation      Barriers to Discharge None      Equipment Recommendations  Rolling walker with 5" wheels    Recommendations for Other Services OT consult   Frequency 7X/week    Precautions / Restrictions Precautions Precautions: None Restrictions Weight Bearing Restrictions: No Other Position/Activity Restrictions: WBAT   Pertinent Vitals/Pain 3/10; premedicated      Mobility  Bed Mobility Bed Mobility: Supine to Sit Supine to Sit: 4: Min assist;3: Mod assist Details for Bed Mobility Assistance: cues for sequence and use of R LE to self assist.  Physical assist with R LE and to bring trunk to upright position Transfers Transfers: Sit to Stand;Stand to Sit Sit to Stand: 4: Min guard Stand to Sit: 4: Min assist Details for Transfer Assistance: cues for LE management and for use of UEs to self assist Ambulation/Gait Ambulation/Gait Assistance: 4: Min assist Ambulation Distance (Feet): 100 Feet Assistive device: Rolling walker Ambulation/Gait Assistance Details: cues for posture, sequence and postion from RW.  Pt progressed to recip gait  Gait Pattern: Step-to pattern;Step-through pattern    Shoulder Instructions     Exercises Total Joint Exercises Ankle Circles/Pumps: AROM;10 reps;Supine;Both Quad Sets: AROM;10 reps;Supine;Both Heel Slides: AAROM;10 reps;Supine;Right Hip ABduction/ADduction: AAROM;10 reps;Supine;Right   PT Diagnosis:    PT Problem List: Decreased  strength;Decreased range of motion;Decreased activity tolerance;Decreased mobility;Decreased knowledge of use of DME;Pain;Obesity PT Treatment Interventions: DME instruction;Gait training;Stair training;Functional mobility training;Therapeutic activities;Therapeutic exercise;Patient/family education   PT Goals Acute Rehab PT Goals PT Goal Formulation: With patient Time For Goal Achievement: 02/21/12 Potential to Achieve Goals: Good Pt will go Supine/Side to Sit: with supervision PT Goal: Supine/Side to Sit - Progress: Goal set today Pt will go Sit to Supine/Side: with supervision PT Goal: Sit to Supine/Side - Progress: Goal set today Pt will go Sit to Stand: with supervision PT Goal: Sit to Stand - Progress: Goal set today Pt will go Stand to Sit: with supervision PT Goal: Stand to Sit - Progress: Goal set today Pt will Ambulate: with supervision;>150 feet;with rolling walker PT Goal: Ambulate - Progress: Goal set today Pt will Go Up / Down Stairs: 1-2 stairs;with min assist;with least restrictive assistive device PT Goal: Up/Down Stairs - Progress: Goal set today  Visit Information  Last PT Received On: 02/17/12 Assistance Needed: +1    Subjective Data  Subjective: I've been afraid to move that leg all night  Patient Stated Goal: Walk without a limp   Prior Functioning  Home Living Lives With: Spouse Available Help at Discharge: Family Type of Home: House Home Access: Stairs to enter Secretary/administrator of Steps: 2 Entrance Stairs-Rails: None Bathroom Shower/Tub: Health visitor: Handicapped height (nothing next to it for hand placement) Additional Comments: has built in shower seat, sock aide and long shoehorn Prior Function Level of Independence: Needs assistance Needs Assistance: Dressing;Bathing Bath: Minimal Dressing: Moderate Communication Communication: No difficulties    Cognition  Overall Cognitive Status: Appears within functional limits  for tasks assessed/performed Arousal/Alertness: Awake/alert Orientation Level:  Oriented X4 / Intact Behavior During Session: [facility] for tasks performed    Extremity/Trunk Assessment Right Upper Extremity Assessment RUE ROM/Strength/Tone: [facility] for tasks assessed (has arthritis in bil shoulders) Left Upper Extremity Assessment LUE ROM/Strength/Tone: WFL for tasks assessed Right Lower Extremity Assessment RLE ROM/Strength/Tone: Deficits RLE ROM/Strength/Tone Deficits: Hip strength 2+/5 with AAROM to 80 flex and 20 abd Left Lower Extremity Assessment LLE ROM/Strength/Tone: WFL for tasks assessed   Balance    End of Session PT - End of Session Equipment Utilized During Treatment: Gait belt Activity Tolerance: Patient tolerated treatment well Patient left: in chair;with call bell/phone within reach;with family/visitor present Nurse Communication: Mobility status  GP     Bruce Morgan 02/17/2012, 12:37 PM

## 2012-02-17 NOTE — Care Management Note (Deleted)
    Page 1 of 2   02/17/2012     10:56:51 AM   CARE MANAGEMENT NOTE 02/17/2012  Patient:  Bruce Morgan, Bruce Morgan   Account Number:  [id number]  Date Initiated:  02/17/2012  Documentation initiated by:  Colleen Can  Subjective/Objective Assessment:   dx Failed rt total hip; Revision rt total hip arthroplasty     Action/Plan:   CM spoke with patient. Plans are for patient to return to his home in [place], [place] where spouse will be caregiver.   Anticipated DC Date:  02/07/2012   Anticipated DC Plan:  HOME W HOME HEALTH SERVICES  In-house referral  NA      DC Planning Services  CM consult      [facility]  HOME HEALTH   Choice offered to / List presented to:  [name]   DME arranged  NA      DME agency  NA     HH arranged  HH-2 PT      HH agency  [facility]   Status of service:  Completed, signed off Medicare Important Message given?  NA - LOS <3 / Initial given by admissions (If response is "NO", the following Medicare IM given date fields will be blank) Date Medicare IM given:   Date Additional Medicare IM given:    Discharge Disposition:  HOME W HOME HEALTH SERVICES  Per UR Regulation:    If discussed at Long Length of Stay Meetings, dates discussed:    Comments:  02/17/2012 Damaris Schooner RN CCM [phone] Pt already has dme -rw, commode seat. [facility] will provide hh services with start date tomorrow 02/18/2012.

## 2012-02-17 NOTE — Op Note (Signed)
NAMEUDELL, MAZZOCCO NO.:  [id number]  MEDICAL RECORD NO.:  [id number]  LOCATION:  1621                         FACILITY:  [facility]  PHYSICIAN:  Vanita Panda. Magnus Ivan, M.D.DATE OF BIRTH:  03-03-1940  DATE OF PROCEDURE:  02/16/2012 DATE OF DISCHARGE:                              OPERATIVE REPORT   PREOPERATIVE DIAGNOSIS:  Severe osteoarthritis and degenerative joint disease, right hip.  POSTOPERATIVE DIAGNOSIS:  Severe osteoarthritis and degenerative joint disease, right hip.  PROCEDURE:  Right total hip arthroplasty through direct anterior approach.  IMPLANTS:  DePuy Sector Gription acetabular component size 54, size 36+ 4 neutral polyethylene liner, Corail femoral component with standard offset, size 9, size 36+ 1.5 metal hip ball.  SURGEON:  Vanita Panda. Magnus Ivan, M.D.  ANESTHESIA:  General.  ANTIBIOTICS:  3 g IV Ancef.  BLOOD LOSS:  350 mL.  COMPLICATIONS:  None.  INDICATIONS:  Mr. Mccleod is a 71 year old gentleman who has had progressively worse hip pain for some time now.  He has had this pain for many years now and walks with a limp.  At this point, he has pain in the groin and MRI that showed significant arthritis in his hip.  He had synovitis as well and fluid collection.  Plain films showed subchondral sclerosis, marginal osteophytes, and joint space narrowing.  Due to his continued pain and decrease in activities, he wished to proceed with a total hip arthroplasty.  The risks and benefits of surgery were explained to him in detail and he did wish to proceed with surgery.  PROCEDURE DESCRIPTION:  After informed consent was obtained, appropriate right hip was marked.  He was brought to the operating room.  General anesthesia was obtained while he was on the stretcher.  Foley catheter was placed and both feet were placed in in-line skeletal traction with traction boots applied on both feet.  He was then placed supine on the HANA fracture  table with perineal post in place and both legs in in-line skeletal traction, but no traction applied.  His hip was then assessed fluoroscopically, so we could get the center of the pelvis to obtain leg lengths and assessment for implant placement.  We then prepped and draped the right hip with DuraPrep and sterile drapes.  Time-out was called and he was identified as correct patient, correct right hip.  I then made an incision just inferior and posterior to the anterior- superior iliac spine and carried this obliquely down the leg.  I dissected down to the tensor fascia lata and the tensor fascia was then divided longitudinally.  I proceeded with a direct anterior approach to the hip.  Cobra retractor was placed around the lateral neck and up underneath the rectus femoris, a Cobra retractor was placed in the medial neck.  I cauterized the lateral femoral circumflex vessels and then divided the hip capsule from superolateral down the lateral and medial.  I placed Cobra retractors within the hip capsule.  I did find a large effusion and significant osteophytes.  Again I coagulated the lateral femoral circumflex vessels.  I then made my femoral neck cut just proximal to the lesser trochanter with an oscillating saw and completed this with  an osteotome.  I then placed a corkscrew on the femoral head and removed the femoral head in its entirety and found to be devoid of cartilage.  I then cleaned the acetabulum of debris and placed a bent Hohmann medially and a Cobra retractor laterally.  I released the transverse acetabular ligament and cleaned remnants of the labrum from around the acetabulum.  I then began reaming from a size 42 reamer in 2 mm increments all the way up to a size 54, with all reamers placed under direct visualization and the last reamer also placed under direct fluoroscopy so I could gain my depth of reaming, my inclination, and anteversion.  Once I was pleased with this, I  placed the real size 54 Sector Gription acetabular component from DePuy and an apex hole eliminator guide.  It had no motion to it also, then I placed the 36+ 4 neutral polyethylene liner.  Attention was then turned to the femur with the leg externally rotated to 90 degrees and all traction off that was extended and adducted.  I placed a temporary retractor underneath the femur within the muscle, Mueller retractor medially and a bent Hohmann behind the greater trochanter.  I released the lateral capsule and piriformis.  I was then able to use a rongeur and a box cutting guide to open the femoral canal.  I then began broaching from a size 8 broached only to a size 9 because his cortices were so thick, did not have a tight feel to it, so I trialed a 36+ 1.5 hip ball.  With that trial in place, we brought the leg back over and up and with traction and internal rotation reduced the hip.  It had no shuck and leg lengths were measured equal under direct fluoroscopy.  I then was able to dislocate the hip and remove the trial components.  We then placed the real Corail femoral component with standard offset, size 9 and the real 36+ 1.5 metal hip ball.  We reduced this back in the acetabulum, again it was stable with internal and external rotation.  I irrigated the soft tissues with normal saline solution.  I closed the joint capsule with interrupted #1 Ethibond suture followed by running 0 V-Loc in the tensor fascia lata, 2-0 Vicryl deep and subcutaneous tissue and staples on the skin.  Xeroform followed by well-padded sterile dressing was applied and he was taken off the HANA table, awakened, extubated, and taken to the recovery room in stable condition.  All final counts were correct. There were no complications noted.     Vanita Panda. Magnus Ivan, M.D.     CYB/MEDQ  D:  02/16/2012  T:  02/16/2012  Job:  [id number]

## 2012-02-18 LAB — CBC
MCH: 33 pg (ref 26.0–34.0)
MCHC: 33 g/dL (ref 30.0–36.0)
MCV: 100 fL (ref 78.0–100.0)
Platelets: 193 10*3/uL (ref 150–400)

## 2012-02-18 MED ORDER — OXYCODONE-ACETAMINOPHEN 5-325 MG PO TABS: 1.0000 | ORAL_TABLET | ORAL | Status: DC | PRN

## 2012-02-18 MED ORDER — ASPIRIN 325 MG PO TBEC: 325.0000 mg | DELAYED_RELEASE_TABLET | Freq: Two times a day (BID) | ORAL | Status: DC

## 2012-02-18 MED ORDER — METHOCARBAMOL 500 MG PO TABS: 500.0000 mg | ORAL_TABLET | Freq: Four times a day (QID) | ORAL | Status: DC | PRN

## 2012-02-18 NOTE — Progress Notes (Signed)
Subjective: 2 Days Post-Op Procedure(s) (LRB): TOTAL HIP ARTHROPLASTY ANTERIOR APPROACH (Right) Patient reports pain as mild.    Objective: Vital signs in last 24 hours: Temp:  [98.1 F (36.7 C)-98.5 F (36.9 C)] 98.1 F (36.7 C) (12/28 0540) Pulse Rate:  [59-65] 63  (12/28 0540) Resp:  [16-18] 18  (12/28 0729) BP: (112-133)/(60-70) 133/70 mmHg (12/28 0540) SpO2:  [2 %-98 %] 98 % (12/28 0729)  Intake/Output from previous day: 12/27 0701 - 12/28 0700 In: 2400 [P.O.:600; I.V.:1800] Out: 1925 [Urine:1925] Intake/Output this shift:     Basename 02/18/12 0449 02/17/12 0444  HGB 11.3* 12.9*    Basename 02/18/12 0449 02/17/12 0444  WBC 11.2* 11.3*  RBC 3.42* 3.81*  HCT 34.2* 37.1*  PLT 193 230    Basename 02/17/12 0444  NA 136  K 3.9  CL 99  CO2 27  BUN 12  CREATININE 0.91  GLUCOSE 153*  CALCIUM 9.3   No results found for this basename: LABPT:2,INR:2 in the last 72 hours  Sensation intact distally Intact pulses distally Dorsiflexion/Plantar flexion intact Incision: scant drainage No cellulitis present  Assessment/Plan: 2 Days Post-Op Procedure(s) (LRB): TOTAL HIP ARTHROPLASTY ANTERIOR APPROACH (Right) Discharge home with home health  Bruce Morgan 02/18/2012, 8:30 AM

## 2012-02-18 NOTE — Progress Notes (Signed)
Pt stable, scripts, and d/c instructions given with no questions/concerns voiced by pt or wife.  Pt transported to private vehicle via wheelchair with NT and wife.

## 2012-02-18 NOTE — Discharge Summary (Signed)
Patient ID: Bruce Morgan MRN: [id number] DOB/AGE: 31-Mar-1940 72 y.o.  Admit date: 02/16/2012 Discharge date: 02/18/2012  Admission Diagnoses:  Principal Problem:  *Degenerative arthritis of hip   Discharge Diagnoses:  Same  Past Medical History  Diagnosis Date  . Hypertension   . Nocturia     3 TO 4 TIMES EVERY NIGHT  . Arthritis     Surgeries: Procedure(s): TOTAL HIP ARTHROPLASTY ANTERIOR APPROACH on 02/16/2012   Consultants:    Discharged Condition: Improved  Hospital Course: Bruce Morgan is an 71 y.o. male who was admitted 02/16/2012 for operative treatment ofDegenerative arthritis of hip. Patient has severe unremitting pain that affects sleep, daily activities, and work/hobbies. After pre-op clearance the patient was taken to the operating room on 02/16/2012 and underwent  Procedure(s): TOTAL HIP ARTHROPLASTY ANTERIOR APPROACH.    Patient was given perioperative antibiotics: Anti-infectives     Start     Dose/Rate Route Frequency Ordered Stop   02/16/12 2000   ceFAZolin (ANCEF) IVPB 1 g/50 mL premix        1 g 100 mL/hr over 30 Minutes Intravenous Every 6 hours 02/16/12 1844 03/11/2012 0759   02/16/12 1037   ceFAZolin (ANCEF) 3 g in dextrose 5 % 50 mL IVPB        3 g 160 mL/hr over 30 Minutes Intravenous 60 min pre-op 02/16/12 1037 02/16/12 1330           Patient was given sequential compression devices, early ambulation, and chemoprophylaxis to prevent DVT.  Patient benefited maximally from hospital stay and there were no complications.    Recent vital signs: Patient Vitals for the past 24 hrs:  BP Temp Temp src Pulse Resp SpO2  02/18/12 0729 - - - - 18  98 %  02/18/12 0540 133/70 mmHg 98.1 F (36.7 C) Oral 63  18  96 %  03-11-12 2148 - - - 62  - -  03-11-12 1600 - - - - 18  93 %  03-11-12 1400 115/64 mmHg 98.5 F (36.9 C) Oral 59  16  92 %  03/11/2012 1200 - - - - 16  -  03-11-2012 1000 112/60 mmHg 98.3 F (36.8 C) Oral 65  16  2 %     Recent  laboratory studies:  Basename 02/18/12 0449 03-11-2012 0444  WBC 11.2* 11.3*  HGB 11.3* 12.9*  HCT 34.2* 37.1*  PLT 193 230  NA -- 136  K -- 3.9  CL -- 99  CO2 -- 27  BUN -- 12  CREATININE -- 0.91  GLUCOSE -- 153*  INR -- --  CALCIUM -- 9.3     Discharge Medications:     Medication List     As of 02/18/2012  8:34 AM    TAKE these medications         aspirin 325 MG EC tablet   Take 1 tablet (325 mg total) by mouth 2 (two) times daily.      atorvastatin 20 MG tablet   Commonly known as: LIPITOR   Take 10 mg by mouth at bedtime.      Co Q 10 100 MG Caps   Take 1 capsule by mouth daily.      diltiazem 240 MG 24 hr capsule   Commonly known as: DILACOR XR   Take 240 mg by mouth daily. TAKES IN AM      diltiazem 240 MG 24 hr capsule   Commonly known as: CARDIZEM CD   Take 240 mg by mouth every  morning.      doxazosin 8 MG tablet   Commonly known as: CARDURA   Take 8 mg by mouth at bedtime.      methocarbamol 500 MG tablet   Commonly known as: ROBAXIN   Take 1 tablet (500 mg total) by mouth every 6 (six) hours as needed.      metoprolol 200 MG 24 hr tablet   Commonly known as: TOPROL-XL   Take 100 mg by mouth at bedtime.      MSM 1000 MG Caps   Take 1 capsule by mouth daily.      naproxen sodium 220 MG tablet   Commonly known as: ANAPROX   Take 220 mg by mouth. TAKES 2 EVERY AM      OVER THE COUNTER MEDICATION   Take 1 capsule by mouth daily. Omega 3 krill oil  daily      OVER THE COUNTER MEDICATION   Reumofan Plus  One daily      oxyCODONE-acetaminophen 5-325 MG per tablet   Commonly known as: PERCOCET/ROXICET   Take 1-2 tablets by mouth every 4 (four) hours as needed for pain.      Saw Palmetto 1000 MG Caps   Take by mouth. TAKES 2 EVERY AM      vitamin B-12 1000 MCG tablet   Commonly known as: CYANOCOBALAMIN   Take 1,000 mcg by mouth daily.        Diagnostic Studies: Dg Chest 2 View  02/07/2012  *RADIOLOGY REPORT*  Clinical Data: Preop  right total hip.  Hypertension.  CHEST - 2 VIEW  Comparison: None  Findings: Heart is normal size.  Linear scarring or atelectasis in the lung bases bilaterally.  Eventration of the hemidiaphragms bilaterally.  No effusions.  No acute bony abnormality. Degenerative spurring throughout the thoracic spine.  IMPRESSION: Bibasilar atelectasis or scarring.   Original Report Authenticated By: Charlett Nose, M.D.    Dg Hip Complete Right  02/16/2012  *RADIOLOGY REPORT*  Clinical Data: Right hip arthroplasty.  RIGHT HIP - COMPLETE 2+ VIEW  Comparison: None  Findings: The femoral and acetabular components are well seated. No complicating features are demonstrated.  IMPRESSION: Well seated components of a total knee arthroplasty.   Original Report Authenticated By: Rudie Meyer, M.D.    Dg Pelvis Portable  02/16/2012  *RADIOLOGY REPORT*  Clinical Data: Postop right hip surgery  PORTABLE PELVIS  Comparison: None.  Findings: Patient is status post right total hip arthroplasty. Satisfactory position and alignment.  IMPRESSION:   As above.   Original Report Authenticated By: Davonna Belling, M.D.    Dg Hip Portable 1 View Right  02/16/2012  *RADIOLOGY REPORT*  Clinical Data: Right hip surgery  PORTABLE RIGHT HIP - 1 VIEW  Comparison: Multiple priors.  Findings: Cross-table lateral demonstrates satisfactory position and alignment status post right total hip arthroplasty.  IMPRESSION: As above.   Original Report Authenticated By: Davonna Belling, M.D.    Dg C-arm 61-120 Min-no Report  02/16/2012  CLINICAL DATA: intra op   C-ARM 61-120 MINUTES  Fluoroscopy was utilized by the requesting physician.  No radiographic  interpretation.      Disposition: to home      Discharge Orders    Future Orders Please Complete By Expires   Diet - low sodium heart healthy      Call MD / Call 911      Comments:   If you experience chest pain or shortness of breath, CALL 911 and be transported to the hospital  emergency room.  If you  develope a fever above 101 F, pus (white drainage) or increased drainage or redness at the wound, or calf pain, call your surgeon's office.   Constipation Prevention      Comments:   Drink plenty of fluids.  Prune juice may be helpful.  You may use a stool softener, such as Colace (over the counter) 100 mg twice a day.  Use MiraLax (over the counter) for constipation as needed.   Increase activity slowly as tolerated      Discharge instructions      Comments:   Increase your activities as comfort allows. Expect right thigh and leg/foot swelling; ice and elevation as needed. You can get your incision wet daily in the shower starting Tuesday 12/31. Keep incision otherwise dry with new dressing daily especially in the groin area.   Discharge patient         Follow-up Information    Follow up with Kathryne Hitch, MD. In 2 weeks.   Contact information:   [address] [place] [address] [place] [address] [phone]           Signed: Kathryne Hitch 02/18/2012, 8:34 AM

## 2012-02-18 NOTE — Progress Notes (Signed)
Physical Therapy Treatment Patient Details Name: Bruce Morgan MRN: [id number] DOB: 01-02-1941 Today's Date: 02/18/2012 Time: 4098-1191 PT Time Calculation (min): 40 min  PT Assessment / Plan / Recommendation Comments on Treatment Session  POD # 2 am session.  Assisted pt OOB to BR to void, amb in hallway, practiced going up 2 steps backward with RW and spouse, then performed THR TE's followinh handout HEP.  Pt also given handout on stairs.  Pt progressing well and ready for D/C to home this am.  Awaiting RW and 3: 1 to be delivered to room before he goes.     Follow Up Recommendations  Home health PT     Does the patient have the potential to tolerate intense rehabilitation     Barriers to Discharge        Equipment Recommendations  Rolling walker with 5" wheels    Recommendations for Other Services    Frequency 7X/week   Plan Discharge plan remains appropriate    Precautions / Restrictions Precautions Precautions: None Precaution Comments: Direct Anterior THR Restrictions Weight Bearing Restrictions: No Other Position/Activity Restrictions: WBAT   Pertinent Vitals/Pain C/o "stiffness" ICE applied    Mobility  Bed Mobility Bed Mobility: Supine to Sit Supine to Sit: 5: Supervision Details for Bed Mobility Assistance: increased time Transfers Transfers: Sit to Stand;Stand to Sit Sit to Stand: 5: Supervision;From bed Stand to Sit: 5: Supervision;To chair/3-in-1 Details for Transfer Assistance: increased time Ambulation/Gait Ambulation/Gait Assistance: 5: Supervision Ambulation Distance (Feet): 145 Feet Assistive device: Rolling walker Ambulation/Gait Assistance Details: one VC on safety with turns and 25% VC's on backward gait sequencing Gait Pattern: Step-to pattern;Step-through pattern Gait velocity: WFL Stairs: Yes Stairs Assistance: 4: Min assist Stairs Assistance Details (indicate cue type and reason): with spouse present for education. Stair Management  Technique: No rails;Backwards;With walker Number of Stairs: 2     Exercises Total Joint Exercises Ankle Circles/Pumps: AROM;Both;10 reps;Supine Quad Sets: AROM;Both;10 reps;Supine Gluteal Sets: AROM;Both;10 reps;Supine Short Arc Quad: AROM;Right;10 reps;Supine Heel Slides: AROM;Right;10 reps;Supine Hip ABduction/ADduction: AROM;Right;10 reps;Supine   PT Goals                                   progressing    Visit Information  Last PT Received On: 02/18/12 Assistance Needed: +1                   End of Session PT - End of Session Equipment Utilized During Treatment: Gait belt Activity Tolerance: Patient tolerated treatment well Patient left: in chair;with call bell/phone within reach;with family/visitor present;Other (comment) (ICE to R hip)   Felecia Shelling  PTA [facility]  Acute  Rehab Pager     [phone]

## 2012-02-25 ENCOUNTER — Encounter (HOSPITAL_COMMUNITY): Payer: Self-pay | Admitting: Anesthesiology

## 2012-02-25 ENCOUNTER — Encounter (HOSPITAL_COMMUNITY)
Admission: EM | Disposition: A | Discharge status: Home Health | Payer: Self-pay | Source: Home / Self Care | Attending: Orthopaedic Surgery

## 2012-02-25 ENCOUNTER — Emergency Department (HOSPITAL_COMMUNITY): Payer: Medicare Other | Admitting: Anesthesiology

## 2012-02-25 ENCOUNTER — Emergency Department (HOSPITAL_COMMUNITY): Payer: Medicare Other

## 2012-02-25 ENCOUNTER — Inpatient Hospital Stay (HOSPITAL_COMMUNITY)
Admission: EM | Admit: 2012-02-25 | Discharge: 2012-03-01 | DRG: 502 | Disposition: A | Discharge status: Home Health | Payer: Medicare Other | Attending: Orthopaedic Surgery | Admitting: Orthopaedic Surgery

## 2012-02-25 ENCOUNTER — Encounter (HOSPITAL_COMMUNITY): Payer: Self-pay | Admitting: Emergency Medicine

## 2012-02-25 DIAGNOSIS — T8459XA Infection and inflammatory reaction due to other internal joint prosthesis, initial encounter: Secondary | ICD-10-CM

## 2012-02-25 DIAGNOSIS — M1610 Unilateral primary osteoarthritis, unspecified hip: Secondary | ICD-10-CM | POA: Diagnosis present

## 2012-02-25 DIAGNOSIS — Y831 Surgical operation with implant of artificial internal device as the cause of abnormal reaction of the patient, or of later complication, without mention of misadventure at the time of the procedure: Secondary | ICD-10-CM | POA: Diagnosis present

## 2012-02-25 DIAGNOSIS — T8450XA Infection and inflammatory reaction due to unspecified internal joint prosthesis, initial encounter: Principal | ICD-10-CM | POA: Diagnosis present

## 2012-02-25 DIAGNOSIS — A4902 Methicillin resistant Staphylococcus aureus infection, unspecified site: Secondary | ICD-10-CM | POA: Diagnosis present

## 2012-02-25 DIAGNOSIS — R351 Nocturia: Secondary | ICD-10-CM | POA: Diagnosis present

## 2012-02-25 DIAGNOSIS — M169 Osteoarthritis of hip, unspecified: Secondary | ICD-10-CM | POA: Diagnosis present

## 2012-02-25 DIAGNOSIS — I10 Essential (primary) hypertension: Secondary | ICD-10-CM | POA: Diagnosis present

## 2012-02-25 DIAGNOSIS — Z96649 Presence of unspecified artificial hip joint: Secondary | ICD-10-CM

## 2012-02-25 HISTORY — PX: INCISION AND DRAINAGE: SHX5863

## 2012-02-25 LAB — CBC WITH DIFFERENTIAL/PLATELET
Eosinophils Absolute: 0.1 10*3/uL (ref 0.0–0.7)
Eosinophils Relative: 1 % (ref 0–5)
Lymphs Abs: 1.1 10*3/uL (ref 0.7–4.0)
MCH: 33.4 pg (ref 26.0–34.0)
MCHC: 34 g/dL (ref 30.0–36.0)
MCV: 98.3 fL (ref 78.0–100.0)
Platelets: 284 10*3/uL (ref 150–400)
RBC: 3.56 MIL/uL — ABNORMAL LOW (ref 4.22–5.81)
RDW: 13 % (ref 11.5–15.5)

## 2012-02-25 LAB — C-REACTIVE PROTEIN: CRP: 8.1 mg/dL — ABNORMAL HIGH (ref <0.60)

## 2012-02-25 LAB — COMPREHENSIVE METABOLIC PANEL
ALT: 25 U/L (ref 0–53)
Calcium: 9.1 mg/dL (ref 8.4–10.5)
GFR calc Af Amer: 76 mL/min — ABNORMAL LOW (ref >90)
Glucose, Bld: 108 mg/dL — ABNORMAL HIGH (ref 70–99)
Sodium: 135 mEq/L (ref 135–145)
Total Protein: 6.6 g/dL (ref 6.0–8.3)

## 2012-02-25 SURGERY — INCISION AND DRAINAGE
Anesthesia: General | Site: Hip | Laterality: Right | Wound class: Dirty or Infected

## 2012-02-25 MED ORDER — MENTHOL 3 MG MT LOZG
1.0000 | LOZENGE | OROMUCOSAL | Status: DC | PRN
  Filled 2012-02-25: qty 9

## 2012-02-25 MED ORDER — DEXTROSE 5 % IV SOLN
3.0000 g | INTRAVENOUS | Status: DC
  Filled 2012-02-25: qty 3000

## 2012-02-25 MED ORDER — PIPERACILLIN-TAZOBACTAM 3.375 G IVPB
3.3750 g | Freq: Three times a day (TID) | INTRAVENOUS | Status: DC
  Administered 2012-02-26 – 2012-02-27 (×6): 3.375 g via INTRAVENOUS
  Filled 2012-02-25 (×7): qty 50

## 2012-02-25 MED ORDER — HYDROMORPHONE HCL PF 1 MG/ML IJ SOLN
0.2500 mg | INTRAMUSCULAR | Status: DC | PRN
  Administered 2012-02-25 (×2): 0.5 mg via INTRAVENOUS

## 2012-02-25 MED ORDER — ACETAMINOPHEN 650 MG RE SUPP
650.0000 mg | Freq: Four times a day (QID) | RECTAL | Status: DC | PRN
  Filled 2012-02-25: qty 1

## 2012-02-25 MED ORDER — OXYCODONE-ACETAMINOPHEN 5-325 MG PO TABS
1.0000 | ORAL_TABLET | ORAL | Status: DC | PRN
  Administered 2012-02-26 – 2012-03-01 (×11): 2 via ORAL
  Filled 2012-02-25 (×11): qty 2

## 2012-02-25 MED ORDER — CO Q 10 100 MG PO CAPS: 1.0000 | ORAL_CAPSULE | Freq: Every day | ORAL | Status: DC

## 2012-02-25 MED ORDER — ASPIRIN EC 325 MG PO TBEC: 325.0000 mg | DELAYED_RELEASE_TABLET | Freq: Every day | ORAL | Status: DC

## 2012-02-25 MED ORDER — HYDROMORPHONE HCL PF 1 MG/ML IJ SOLN
INTRAMUSCULAR | Status: AC
  Filled 2012-02-25: qty 1

## 2012-02-25 MED ORDER — METOCLOPRAMIDE HCL 5 MG/ML IJ SOLN
5.0000 mg | Freq: Three times a day (TID) | INTRAMUSCULAR | Status: DC | PRN
  Administered 2012-02-26: 10 mg via INTRAVENOUS
  Filled 2012-02-25: qty 2

## 2012-02-25 MED ORDER — VANCOMYCIN HCL 1000 MG IV SOLR
INTRAVENOUS | Status: AC
  Filled 2012-02-25: qty 1000

## 2012-02-25 MED ORDER — SODIUM CHLORIDE 0.9 % IR SOLN
Status: DC | PRN
  Administered 2012-02-25: 9000 mL

## 2012-02-25 MED ORDER — SUCCINYLCHOLINE CHLORIDE 20 MG/ML IJ SOLN
INTRAMUSCULAR | Status: DC | PRN
  Administered 2012-02-25: 100 mg via INTRAVENOUS

## 2012-02-25 MED ORDER — VANCOMYCIN HCL 1000 MG IV SOLR
INTRAVENOUS | Status: DC | PRN
  Administered 2012-02-25: 1000 mg

## 2012-02-25 MED ORDER — SAW PALMETTO (SERENOA REPENS) 80 MG PO CAPS: 160.0000 mg | ORAL_CAPSULE | Freq: Every day | ORAL | Status: DC

## 2012-02-25 MED ORDER — VANCOMYCIN HCL 1000 MG IV SOLR
1000.0000 mg | Freq: Two times a day (BID) | INTRAVENOUS | Status: DC
  Filled 2012-02-25 (×2): qty 1000

## 2012-02-25 MED ORDER — LACTATED RINGERS IV SOLN: INTRAVENOUS | Status: DC

## 2012-02-25 MED ORDER — METOPROLOL SUCCINATE ER 100 MG PO TB24
100.0000 mg | ORAL_TABLET | Freq: Every day | ORAL | Status: DC
  Administered 2012-02-26 – 2012-02-29 (×5): 100 mg via ORAL
  Filled 2012-02-25 (×7): qty 1

## 2012-02-25 MED ORDER — ONDANSETRON HCL 4 MG/2ML IJ SOLN
4.0000 mg | Freq: Four times a day (QID) | INTRAMUSCULAR | Status: DC | PRN
  Administered 2012-02-26: 4 mg via INTRAVENOUS
  Filled 2012-02-25: qty 2

## 2012-02-25 MED ORDER — VITAMIN B-12 1000 MCG PO TABS
1000.0000 ug | ORAL_TABLET | Freq: Every day | ORAL | Status: DC
  Administered 2012-02-26 – 2012-03-01 (×5): 1000 ug via ORAL
  Filled 2012-02-25 (×6): qty 1

## 2012-02-25 MED ORDER — VANCOMYCIN HCL IN DEXTROSE 1-5 GM/200ML-% IV SOLN
INTRAVENOUS | Status: AC
  Filled 2012-02-25: qty 200

## 2012-02-25 MED ORDER — DILTIAZEM HCL ER COATED BEADS 240 MG PO CP24
240.0000 mg | ORAL_CAPSULE | Freq: Every morning | ORAL | Status: DC
  Administered 2012-02-26 – 2012-03-01 (×5): 240 mg via ORAL
  Filled 2012-02-25 (×6): qty 1

## 2012-02-25 MED ORDER — METHOCARBAMOL 500 MG PO TABS
500.0000 mg | ORAL_TABLET | Freq: Four times a day (QID) | ORAL | Status: DC | PRN
  Administered 2012-02-26 – 2012-03-01 (×10): 500 mg via ORAL
  Filled 2012-02-25 (×10): qty 1

## 2012-02-25 MED ORDER — FENTANYL CITRATE 0.05 MG/ML IJ SOLN
INTRAMUSCULAR | Status: DC | PRN
  Administered 2012-02-25: 50 ug via INTRAVENOUS
  Administered 2012-02-25 (×2): 25 ug via INTRAVENOUS
  Administered 2012-02-25: 100 ug via INTRAVENOUS
  Administered 2012-02-25: 50 ug via INTRAVENOUS

## 2012-02-25 MED ORDER — MSM 1000 MG PO CAPS: 1.0000 | ORAL_CAPSULE | Freq: Every day | ORAL | Status: DC

## 2012-02-25 MED ORDER — METOCLOPRAMIDE HCL 5 MG PO TABS
5.0000 mg | ORAL_TABLET | Freq: Three times a day (TID) | ORAL | Status: DC | PRN
  Filled 2012-02-25: qty 2

## 2012-02-25 MED ORDER — ALUM & MAG HYDROXIDE-SIMETH 200-200-20 MG/5ML PO SUSP
30.0000 mL | ORAL | Status: DC | PRN
  Filled 2012-02-25: qty 30

## 2012-02-25 MED ORDER — ACETAMINOPHEN 10 MG/ML IV SOLN
INTRAVENOUS | Status: DC | PRN
  Administered 2012-02-25: 1000 mg via INTRAVENOUS

## 2012-02-25 MED ORDER — PROMETHAZINE HCL 25 MG/ML IJ SOLN: 6.2500 mg | INTRAMUSCULAR | Status: DC | PRN

## 2012-02-25 MED ORDER — PROPOFOL 10 MG/ML IV BOLUS
INTRAVENOUS | Status: DC | PRN
  Administered 2012-02-25: 150 mg via INTRAVENOUS

## 2012-02-25 MED ORDER — VANCOMYCIN HCL 1000 MG IV SOLR
1000.0000 mg | Freq: Two times a day (BID) | INTRAVENOUS | Status: DC
  Administered 2012-02-25: 1000 mg via INTRAVENOUS
  Filled 2012-02-25 (×3): qty 1000

## 2012-02-25 MED ORDER — DIPHENHYDRAMINE HCL 12.5 MG/5ML PO ELIX
12.5000 mg | ORAL_SOLUTION | ORAL | Status: DC | PRN
  Administered 2012-02-27: 25 mg via ORAL
  Administered 2012-02-28: 12.5 mg via ORAL
  Administered 2012-02-29: 25 mg via ORAL
  Filled 2012-02-25 (×4): qty 10

## 2012-02-25 MED ORDER — VANCOMYCIN HCL 10 G IV SOLR
1500.0000 mg | INTRAVENOUS | Status: DC
  Filled 2012-02-25: qty 1500

## 2012-02-25 MED ORDER — KCL IN DEXTROSE-NACL 20-5-0.45 MEQ/L-%-% IV SOLN
INTRAVENOUS | Status: DC
  Administered 2012-02-26 – 2012-02-28 (×3): via INTRAVENOUS
  Filled 2012-02-25 (×6): qty 1000

## 2012-02-25 MED ORDER — TOBRAMYCIN SULFATE 1.2 G IJ SOLR
INTRAMUSCULAR | Status: DC | PRN
  Administered 2012-02-25: 1.2 g

## 2012-02-25 MED ORDER — CEFAZOLIN SODIUM 1-5 GM-% IV SOLN
INTRAVENOUS | Status: AC
  Filled 2012-02-25: qty 50

## 2012-02-25 MED ORDER — METHOCARBAMOL 500 MG PO TABS: 500.0000 mg | ORAL_TABLET | Freq: Four times a day (QID) | ORAL | Status: DC | PRN

## 2012-02-25 MED ORDER — ONDANSETRON HCL 4 MG/2ML IJ SOLN
INTRAMUSCULAR | Status: DC | PRN
  Administered 2012-02-25: 4 mg via INTRAVENOUS

## 2012-02-25 MED ORDER — PHENYLEPHRINE HCL 10 MG/ML IJ SOLN
INTRAMUSCULAR | Status: DC | PRN
  Administered 2012-02-25: 40 ug via INTRAVENOUS
  Administered 2012-02-25: 80 ug via INTRAVENOUS
  Administered 2012-02-25: 40 ug via INTRAVENOUS
  Administered 2012-02-25: 80 ug via INTRAVENOUS

## 2012-02-25 MED ORDER — ACETAMINOPHEN 325 MG PO TABS: 650.0000 mg | ORAL_TABLET | Freq: Four times a day (QID) | ORAL | Status: DC | PRN

## 2012-02-25 MED ORDER — CEFAZOLIN SODIUM-DEXTROSE 2-3 GM-% IV SOLR
INTRAVENOUS | Status: AC
  Filled 2012-02-25: qty 50

## 2012-02-25 MED ORDER — 0.9 % SODIUM CHLORIDE (POUR BTL) OPTIME
TOPICAL | Status: DC | PRN
  Administered 2012-02-25: 1000 mL

## 2012-02-25 MED ORDER — PHENOL 1.4 % MT LIQD
1.0000 | OROMUCOSAL | Status: DC | PRN
  Filled 2012-02-25: qty 177

## 2012-02-25 MED ORDER — POLYETHYLENE GLYCOL 3350 17 G PO PACK
17.0000 g | PACK | Freq: Every day | ORAL | Status: DC
  Administered 2012-02-27 – 2012-03-01 (×4): 17 g via ORAL
  Filled 2012-02-25 (×6): qty 1

## 2012-02-25 MED ORDER — ONDANSETRON HCL 4 MG PO TABS
4.0000 mg | ORAL_TABLET | Freq: Four times a day (QID) | ORAL | Status: DC | PRN
  Filled 2012-02-25: qty 1

## 2012-02-25 MED ORDER — POLYETHYLENE GLYCOL 3350 17 G PO PACK
17.0000 g | PACK | Freq: Every day | ORAL | Status: DC | PRN
  Filled 2012-02-25: qty 1

## 2012-02-25 MED ORDER — LACTATED RINGERS IV SOLN
INTRAVENOUS | Status: DC
  Administered 2012-02-25 (×2): via INTRAVENOUS

## 2012-02-25 MED ORDER — CEFAZOLIN SODIUM-DEXTROSE 2-3 GM-% IV SOLR
2.0000 g | Freq: Once | INTRAVENOUS | Status: DC
  Administered 2012-02-25: 3 g via INTRAVENOUS

## 2012-02-25 MED ORDER — ACETAMINOPHEN 10 MG/ML IV SOLN
INTRAVENOUS | Status: AC
  Filled 2012-02-25: qty 100

## 2012-02-25 MED ORDER — ASPIRIN EC 325 MG PO TBEC: 325.0000 mg | DELAYED_RELEASE_TABLET | Freq: Two times a day (BID) | ORAL | Status: DC

## 2012-02-25 MED ORDER — TOBRAMYCIN SULFATE 1.2 G IJ SOLR
INTRAMUSCULAR | Status: AC
  Filled 2012-02-25: qty 1.2

## 2012-02-25 MED ORDER — VANCOMYCIN HCL IN DEXTROSE 1-5 GM/200ML-% IV SOLN
1000.0000 mg | Freq: Two times a day (BID) | INTRAVENOUS | Status: DC
  Administered 2012-02-26 – 2012-02-27 (×4): 1000 mg via INTRAVENOUS
  Filled 2012-02-25 (×5): qty 200

## 2012-02-25 MED ORDER — MORPHINE SULFATE 10 MG/ML IJ SOLN: 2.0000 mg | INTRAMUSCULAR | Status: DC | PRN

## 2012-02-25 MED ORDER — ATORVASTATIN CALCIUM 10 MG PO TABS
10.0000 mg | ORAL_TABLET | Freq: Every day | ORAL | Status: DC
  Administered 2012-02-26 – 2012-02-29 (×5): 10 mg via ORAL
  Filled 2012-02-25 (×7): qty 1

## 2012-02-25 MED ORDER — ASPIRIN EC 325 MG PO TBEC
325.0000 mg | DELAYED_RELEASE_TABLET | Freq: Two times a day (BID) | ORAL | Status: DC
  Administered 2012-02-26 – 2012-02-27 (×4): 325 mg via ORAL
  Filled 2012-02-25 (×6): qty 1

## 2012-02-25 MED ORDER — DOXAZOSIN MESYLATE 8 MG PO TABS
8.0000 mg | ORAL_TABLET | Freq: Every day | ORAL | Status: DC
  Administered 2012-02-26 – 2012-02-29 (×5): 8 mg via ORAL
  Filled 2012-02-25 (×7): qty 1

## 2012-02-25 SURGICAL SUPPLY — 13 items
DRSG AQUACEL AG ADV 3.5X10 (GAUZE/BANDAGES/DRESSINGS) ×2 IMPLANT
EVACUATOR 1/8 PVC DRAIN (DRAIN) ×2 IMPLANT
EVACUATOR 3/16  PVC DRAIN (DRAIN)
EVACUATOR 3/16 PVC DRAIN (DRAIN) IMPLANT
GAUZE XEROFORM 1X8 LF (GAUZE/BANDAGES/DRESSINGS) ×2 IMPLANT
KIT STIMULAN RAPID CURE  10CC (Orthopedic Implant) ×1 IMPLANT
KIT STIMULAN RAPID CURE 10CC (Orthopedic Implant) ×1 IMPLANT
PACK TOTAL JOINT (CUSTOM PROCEDURE TRAY) ×2 IMPLANT
SUT ETHILON 2 0 PSLX (SUTURE) ×4 IMPLANT
SUT PDS AB 1 CT1 27 (SUTURE) ×4 IMPLANT
SUT PDS AB 2-0 CT2 27 (SUTURE) ×4 IMPLANT
SWAB COLLECTION DEVICE MRSA (MISCELLANEOUS) ×4 IMPLANT
TUBE ANAEROBIC SPECIMEN COL (MISCELLANEOUS) ×4 IMPLANT

## 2012-02-25 NOTE — Progress Notes (Signed)
ANTIBIOTIC CONSULT NOTE - INITIAL  Pharmacy Consult for Vancomycin, Zosyn Indication: S/P THA op site infection  No Known Allergies  Patient Measurements: Height: 5\' 11"  (180.3 cm) Weight: 266 lb (120.657 kg) IBW/kg (Calculated) : 75.3    Vital Signs: Temp: 98.5 F (36.9 C) (01/04 2239) Temp src: Oral (01/04 1741) BP: 120/72 mmHg (01/04 2239) Pulse Rate: 73  (01/04 2239) Intake/Output from previous day:   Intake/Output from this shift: Total I/O In: 1800 [I.V.:1800] Out: 150 [Blood:150]  Labs:  Basename 02/25/12 1830  WBC 13.0*  HGB 11.9*  PLT 284  LABCREA --  CREATININE 1.11   Estimated Creatinine Clearance: 81.9 ml/min (by C-G formula based on Cr of 1.11). No results found for this basename: VANCOTROUGH:2,VANCOPEAK:2,VANCORANDOM:2,GENTTROUGH:2,GENTPEAK:2,GENTRANDOM:2,TOBRATROUGH:2,TOBRAPEAK:2,TOBRARND:2,AMIKACINPEAK:2,AMIKACINTROU:2,AMIKACIN:2, in the last 72 hours   Microbiology: Recent Results (from the past 720 hour(s))  SURGICAL PCR SCREEN     Status: Normal   Collection Time   02/07/12 10:05 AM      Component Value Range Status Comment   MRSA, PCR NEGATIVE  NEGATIVE Final    Staphylococcus aureus NEGATIVE  NEGATIVE Final     Medical History: Past Medical History  Diagnosis Date  . Hypertension   . Nocturia     3 TO 4 TIMES EVERY NIGHT  . Arthritis     Medications:  Scheduled:    . aspirin  325 mg Oral BID  . atorvastatin  10 mg Oral QHS  . diltiazem  240 mg Oral q morning - 10a  . doxazosin  8 mg Oral QHS  . [COMPLETED] HYDROmorphone      . metoprolol  100 mg Oral QHS  . polyethylene glycol  17 g Oral Daily  . vitamin B-12  1,000 mcg Oral Daily  . [DISCONTINUED] aspirin EC  325 mg Oral BID  . [DISCONTINUED] aspirin EC  325 mg Oral Q breakfast  . [DISCONTINUED]  ceFAZolin (ANCEF) IV  3 g Intravenous 60 min Pre-Op  . [COMPLETED] ceFAZolin  2 g Intravenous Once  . [DISCONTINUED] Co Q 10  1 capsule Oral Daily  . [DISCONTINUED] MSM  1 capsule  Oral Daily  . [DISCONTINUED] saw palmetto  160 mg Oral Daily  . [DISCONTINUED] vancomycin (VANCOCIN) 1000 mg IVPB  1,000 mg Intravenous BID  . [DISCONTINUED] vancomycin (VANCOCIN) 1000 mg IVPB  1,000 mg Intravenous BID  . [DISCONTINUED] vancomycin  1,500 mg Intravenous 60 min Pre-Op   Infusions:    . dextrose 5 % and 0.45 % NaCl with KCl 20 mEq/L    . [DISCONTINUED] lactated ringers    . [DISCONTINUED] lactated ringers     Assessment:  72 yr old male s/p recent right THA (02/16/12) with complaint of fever, pain, erythema and incisional drainage  S/P I&D  Received Ancef 2gm IV x 1 @ 20:04 and Vancomycin 1gm IV x 1 @ 20:11.  Vanc 1gm powder topically used @ 20:28  Vancomycin and Zosyn ordered per pharmacy post-op for superficial infection of incision site  Goal of Therapy:  Vancomycin trough level 10-15 mcg/ml  Plan:  Measure antibiotic drug levels at steady state Follow up culture results Zosyn 3.375gm IV q8h (each dose infused over 4 hrs) Vancomycin 1gm IV q12h  Barry Culverhouse, Joselyn Glassman, PharmD 02/25/2012,11:11 PM

## 2012-02-25 NOTE — Progress Notes (Signed)
PHARMACIST - PHYSICIAN ORDER COMMUNICATION  CONCERNING: P&T Medication Policy on Herbal Medications  DESCRIPTION:  This patient's order for:  Saw Palmetto, Co Q 10 and MSM  has been noted.  These products are classified as an "herbal" or natural product. Due to a lack of definitive safety studies or FDA approval, nonstandard manufacturing practices, plus the potential risk of unknown drug-drug interactions while on inpatient medications, the Pharmacy and Therapeutics Committee does not permit the use of "herbal" or natural products of this type within [facility].   ACTION TAKEN: The pharmacy department is unable to verify this order at this time and your patient has been informed of this safety policy. Please reevaluate patient's clinical condition at discharge and address if the herbal or natural product(s) should be resumed at that time.   Thank you, Terrilee Files, PharmD 02/25/12 @ 23:06

## 2012-02-25 NOTE — H&P (Signed)
Bruce Morgan is an 72 y.o. male.   Chief Complaint: right THA  From 02/16/12 with erythema , drainage and increased pain fever 101.7 HPI: 72 yo male with 2 day Hx of increased right hip pain with increased incisional drainage and erythema around incision, previous tape blisters with fever todayto 101.7 per his wife.   Past Medical History  Diagnosis Date  . Hypertension   . Nocturia     3 TO 4 TIMES EVERY NIGHT  . Arthritis     Past Surgical History  Procedure Date  . Teeth extractions / permanent dental implants 2012  . Tonsillectomy     AS A CHILD  . 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 No pertinent family history. Social History:  reports that he has never smoked. He has never used smokeless tobacco. He reports that he does not drink alcohol or use illicit drugs.  Allergies: No Known Allergies   (Not in a hospital admission)  No results found for this or any previous visit (from the past 48 hour(s)). No results found.  Review of Systems  Constitutional: Positive for fever and malaise/fatigue. Negative for chills and weight loss.  HENT: Negative.   Eyes: Negative.   Respiratory: Negative.   Cardiovascular:       HTN  Gastrointestinal: Negative.   Genitourinary: Negative.   Musculoskeletal:       Right THA on 12/26  Did well until 3 days ago with right hip incision erythema  Skin:       Right hip tape blisters   Neurological: Negative.  Negative for weakness.  Psychiatric/Behavioral: Negative.     Blood pressure 124/64, pulse 89, temperature 100.7 F (38.2 C), temperature source Oral, resp. rate 20, height 5\' 11"  (1.803 m), weight 120.657 kg (266 lb), SpO2 97.00%. Physical Exam  Constitutional: He appears well-developed and well-nourished.  HENT:  Head:  Normocephalic.  Eyes: Pupils are equal, round, and reactive to light.  Neck: Normal range of motion.  Cardiovascular: Normal rate and regular rhythm.   Respiratory: Effort normal.  GI: Soft.  Musculoskeletal: Normal range of motion. He exhibits no edema.  Neurological: He is alert.  Skin: There is erythema.  Psychiatric: He has a normal mood and affect. His behavior is normal. Thought content normal.   right hip erythema ,some serosang drainage, fluctuant  n purulent drainage, increased erythema around hip incision since yesterday   Assessment/Plan   Right THA post op infection for I and D of hip and placement of antibiotic beads.  Will hold ABX until cultures obtained. Extensive discussion with patient and wife, procedure discussed, he agrees and wishes to proceed.   YATES,MARK C 02/25/2012, 6:32 PM

## 2012-02-25 NOTE — Transfer of Care (Signed)
Immediate Anesthesia Transfer of Care Note  Patient: Bruce Morgan  Procedure(s) Performed: Procedure(s) (LRB) with comments: INCISION AND DRAINAGE (Right)  Patient Location: PACU  Anesthesia Type:General  Level of Consciousness: awake, alert , oriented and patient cooperative  Airway & Oxygen Therapy: Patient Spontanous Breathing and Patient connected to face mask oxygen  Post-op Assessment: Report given to PACU RN and Post -op Vital signs reviewed and stable  Post vital signs: Reviewed and stable  Complications: No apparent anesthesia complications

## 2012-02-25 NOTE — Preoperative (Signed)
Beta Blockers   Reason not to administer Beta Blockers:Not Applicable 

## 2012-02-25 NOTE — Brief Op Note (Signed)
02/25/2012  8:30 PM  PATIENT:  Bruce Morgan  72 y.o. male  PRE-OPERATIVE DIAGNOSIS:  post op right hip infection  POST-OPERATIVE DIAGNOSIS: superficial post op THA infection  PROCEDURE:  Procedure(s) (LRB) with comments: INCISION AND DRAINAGE (Right) TOTAL HIP ARTHROPLASTY ANTERIOR APPROACH (Right) Excisional debridement   SURGEON:  Surgeon(s) and Role:    * Eldred Manges, MD - Primary    * Kathryne Hitch, MD - Assisting  PHYSICIAN ASSISTANT:   ASSISTANTSMaureen Ralphs MD   ANESTHESIA:   general  EBL:  Total I/O In: -  Out: 150 [Blood:150]  BLOOD ADMINISTERED:none  DRAINS: HV right hip  LOCAL MEDICATIONS USED:  NONE  SPECIMEN:  No Specimen  Cultures superficial and deep  DISPOSITION OF SPECIMEN:  N/A  COUNTS:  YES  TOURNIQUET:  * No tourniquets in log *  DICTATION: .Other Dictation: Dictation Number [id number]  PLAN OF CARE: Admit to inpatient   PATIENT DISPOSITION:  PACU - hemodynamically stable.   Delay start of Pharmacological VTE agent (>24hrs) due to surgical blood loss or risk of bleeding: ASA and SCD's

## 2012-02-25 NOTE — Anesthesia Postprocedure Evaluation (Signed)
  Anesthesia Post-op Note  Patient: Bruce Morgan  Procedure(s) Performed: Procedure(s) (LRB) with comments: INCISION AND DRAINAGE (Right)  Patient Location: PACU  Anesthesia Type:General  Level of Consciousness: awake, alert , oriented and patient cooperative  Airway and Oxygen Therapy: Patient Spontanous Breathing and Patient connected to nasal cannula oxygen  Post-op Pain: none  Post-op Assessment: Post-op Vital signs reviewed, Patient's Cardiovascular Status Stable, Respiratory Function Stable, Patent Airway, No signs of Nausea or vomiting and Pain level controlled  Post-op Vital Signs: Reviewed and stable  Complications: No apparent anesthesia complications

## 2012-02-25 NOTE — ED Notes (Signed)
Patient reports right hip replacement on Thursday 02/16/2012.  Patient reports redness, purulent drainage, and fever up to 101.7 today.  Symptoms began this morning around 0730.

## 2012-02-25 NOTE — ED Notes (Signed)
Pt changing into gown with wife's help. He declines staff's assistance. Pt is aware of the need for a urine sample. Urinal at bedside.

## 2012-02-25 NOTE — Anesthesia Preprocedure Evaluation (Addendum)
Anesthesia Evaluation  Patient identified by MRN, date of birth, ID band Patient awake    Reviewed: Allergy & Precautions, H&P , NPO status , Patient's Chart, lab work & pertinent test results  Airway Mallampati: II TM Distance: >3 FB Neck ROM: full    Dental No notable dental hx. (+) Teeth Intact and Dental Advisory Given   Pulmonary neg pulmonary ROS,  breath sounds clear to auscultation  Pulmonary exam normal       Cardiovascular Exercise Tolerance: Good hypertension, Pt. on medications and Pt. on home beta blockers Rhythm:regular Rate:Normal     Neuro/Psych negative neurological ROS  negative psych ROS   GI/Hepatic negative GI ROS, Neg liver ROS,   Endo/Other  Morbid obesity  Renal/GU negative Renal ROS  negative genitourinary   Musculoskeletal   Abdominal   Peds  Hematology negative hematology ROS (+)   Anesthesia Other Findings   Reproductive/Obstetrics negative OB ROS                           Anesthesia Physical Anesthesia Plan  ASA: II and emergent  Anesthesia Plan: General   Post-op Pain Management:    Induction: Intravenous, Rapid sequence and Cricoid pressure planned  Airway Management Planned:   Additional Equipment:   Intra-op Plan:   Post-operative Plan: Extubation in OR  Informed Consent: I have reviewed the patients History and Physical, chart, labs and discussed the procedure including the risks, benefits and alternatives for the proposed anesthesia with the patient or authorized representative who has indicated his/her understanding and acceptance.   Dental advisory given  Plan Discussed with: CRNA  Anesthesia Plan Comments:         Anesthesia Quick Evaluation

## 2012-02-26 ENCOUNTER — Encounter (HOSPITAL_COMMUNITY): Payer: Self-pay | Admitting: *Deleted

## 2012-02-26 LAB — CBC
Platelets: 282 10*3/uL (ref 150–400)
RDW: 13.2 % (ref 11.5–15.5)
WBC: 16.7 10*3/uL — ABNORMAL HIGH (ref 4.0–10.5)

## 2012-02-26 LAB — URINALYSIS, ROUTINE W REFLEX MICROSCOPIC
Leukocytes, UA: NEGATIVE
Nitrite: NEGATIVE
Specific Gravity, Urine: 1.024 (ref 1.005–1.030)
Urobilinogen, UA: 0.2 mg/dL (ref 0.0–1.0)

## 2012-02-26 LAB — URINE MICROSCOPIC-ADD ON

## 2012-02-26 MED ORDER — SODIUM CHLORIDE 0.9 % IJ SOLN
10.0000 mL | INTRAMUSCULAR | Status: DC | PRN
  Administered 2012-02-28 – 2012-03-01 (×3): 10 mL

## 2012-02-26 NOTE — Evaluation (Signed)
Physical Therapy Evaluation Patient Details Name: Bruce Morgan MRN: [id number] DOB: 12/17/1940 Today's Date: 02/26/2012 Time: 1000-1019 PT Time Calculation (min): 19 min  PT Assessment / Plan / Recommendation Clinical Impression  72 y.o. s/p R direct anterior THA 02/16/12 now admitted with infected R total hip, s/p I&D and placement of antibiotic beads 02/25/12.  Pt ambulated 220' with RW independently, good progress expected.     PT Assessment  Patient needs continued PT services    Follow Up Recommendations  Home health PT    Does the patient have the potential to tolerate intense rehabilitation      Barriers to Discharge        Equipment Recommendations  None recommended by PT    Recommendations for Other Services     Frequency 7X/week    Precautions / Restrictions Precautions Precautions: None Precaution Comments: Direct Anterior THR Restrictions Weight Bearing Restrictions: No Other Position/Activity Restrictions: WBAT   Pertinent Vitals/Pain *6/10 R hip surgical pain Pt premedicated (ice to be applied after PICC procedure)**      Mobility  Bed Mobility Bed Mobility: Supine to Sit;Sit to Supine Supine to Sit: 6: Modified independent (Device/Increase time);With rails Sit to Supine: 4: Min assist Details for Bed Mobility Assistance: min A for RLE into bed Transfers Transfers: Sit to Stand;Stand to Sit Sit to Stand: From bed;6: Modified independent (Device/Increase time) Stand to Sit: To bed;6: Modified independent (Device/Increase time) Ambulation/Gait Ambulation/Gait Assistance: 6: Modified independent (Device/Increase time) Ambulation Distance (Feet): 220 Feet Assistive device: Rolling walker Gait Pattern: Step-through pattern Gait velocity: WFL Stairs: No    Shoulder Instructions     Exercises Total Joint Exercises Ankle Circles/Pumps: AROM;10 reps;Supine;Both Long Arc Quad: AROM;Right;20 reps;Seated   PT Diagnosis: Acute pain  PT Problem List:  Decreased strength;Decreased activity tolerance;Pain;Obesity PT Treatment Interventions: Functional mobility training;Therapeutic exercise;Gait training   PT Goals Acute Rehab PT Goals PT Goal Formulation: With patient/family Time For Goal Achievement: 02/29/12 Potential to Achieve Goals: Good Pt will go Sit to Supine/Side: Independently Pt will Ambulate: with modified independence;Other (comment);with rolling walker ( greater than 300' ) Pt will Perform Home Exercise Program: Independently PT Goal: Perform Home Exercise Program - Progress: Goal set today  Visit Information  Last PT Received On: 02/26/12 Assistance Needed: +1    Subjective Data  Subjective: I'm ready to get up and move.  Patient Stated Goal: to walk   Prior Functioning  Home Living Lives With: Spouse Home Access: Stairs to enter Secretary/administrator of Steps: 2 Bathroom Shower/Tub: Health visitor: Handicapped height (nothing next to it for hand placement) Home Adaptive Equipment: Walker - rolling Additional Comments: has built in shower seat, sock aide and long shoehorn Prior Function Level of Independence: Needs assistance Needs Assistance: Dressing;Bathing Bath: Minimal Dressing: Moderate Communication Communication: No difficulties    Cognition  Overall Cognitive Status: Appears within functional limits for tasks assessed/performed Arousal/Alertness: Awake/alert Orientation Level: Oriented X4 / Intact Behavior During Session: [facility] for tasks performed    Extremity/Trunk Assessment Right Upper Extremity Assessment RUE ROM/Strength/Tone: [facility] for tasks assessed (has arthritis in bil shoulders) Left Upper Extremity Assessment LUE ROM/Strength/Tone: WFL for tasks assessed Right Lower Extremity Assessment RLE ROM/Strength/Tone: Deficits RLE ROM/Strength/Tone Deficits: knee ext 5/5, hip 3/5 grossly RLE Sensation: WFL - Light Touch RLE Coordination: WFL - gross/fine motor Left Lower  Extremity Assessment LLE ROM/Strength/Tone: Within functional levels LLE Sensation: WFL - Light Touch LLE Coordination: WFL - gross/fine motor Trunk Assessment Trunk Assessment: Normal   Balance  End of Session PT - End of Session Activity Tolerance: Patient tolerated treatment well Patient left: with call bell/phone within reach;with family/visitor present;in bed Nurse Communication: Mobility status  GP     [name] 02/26/2012, 10:28 AM  [phone]

## 2012-02-26 NOTE — Progress Notes (Deleted)
PT Cancellation Note  Patient Details Name: Bruce Morgan MRN: [id number] DOB: 1941/01/07   Cancelled Treatment:    Reason Eval/Treat Not Completed: Other (comment) (Pt refused PT, stated he walked fine last night. ) Pt reported that he doesn't want to walk right now, he gets around "just fine" without assistive device. Will re-attempt later today.    [name] 02/26/2012, 7:54 AM [phone]

## 2012-02-26 NOTE — Progress Notes (Addendum)
Subjective: 1 Day Post-Op Procedure(s) (LRB): INCISION AND DRAINAGE (Right) Patient reports pain as moderate.    Objective: Vital signs in last 24 hours: Temp:  [98.5 F (36.9 C)-100.7 F (38.2 C)] 99.6 F (37.6 C) (01/05 0545) Pulse Rate:  [72-89] 82  (01/05 0545) Resp:  [15-20] 16  (01/05 0800) BP: (85-145)/(53-72) 99/67 mmHg (01/05 0545) SpO2:  [94 %-98 %] 96 % (01/05 0800) Weight:  [120.657 kg (266 lb)] 120.657 kg (266 lb) (01/04 1741)  Intake/Output from previous day: 01/04 0701 - 01/05 0700 In: 2283.3 [I.V.:2231.3; IV Piggyback:52] Out: 670 [Urine:500; Drains:20; Blood:150] Intake/Output this shift: Total I/O In: -  Out: 250 [Urine:250]   Basename 02/26/12 0429 02/25/12 1830  HGB 11.1* 11.9*    Basename 02/26/12 0429 02/25/12 1830  WBC 16.7* 13.0*  RBC 3.35* 3.56*  HCT 33.0* 35.0*  PLT 282 284    Basename 02/25/12 1830  NA 135  K 4.0  CL 98  CO2 27  BUN 20  CREATININE 1.11  GLUCOSE 108*  CALCIUM 9.1   No results found for this basename: LABPT:2,INR:2 in the last 72 hours  Neurologically intact  Assessment/Plan: 1 Day Post-Op Procedure(s) (LRB): INCISION AND DRAINAGE (Right) Continue ABX therapy due to Post-op infection   Getting PIC line right now.  Cont double ABX, vanc, zosyn pending cultures, gram stain was neg.    Hgb is stable. Sed rate pending from yesterday Malory Spurr C 02/26/2012, 10:46 AM  CRP was 8.1

## 2012-02-26 NOTE — Progress Notes (Signed)
Peripherally Inserted Central Catheter/Midline Placement  The IV Nurse has discussed with the patient and/or persons authorized to consent for the patient, the purpose of this procedure and the potential benefits and risks involved with this procedure.  The benefits include less needle sticks, lab draws from the catheter and patient may be discharged home with the catheter.  Risks include, but not limited to, infection, bleeding, blood clot (thrombus formation), and puncture of an artery; nerve damage and irregular heat beat.  Alternatives to this procedure were also discussed.  PICC/Midline Placement Documentation  PICC / Midline Single Lumen 02/26/12 PICC Right Basilic (Active)       Stacie Glaze Horton 02/26/2012, 11:43 AM

## 2012-02-26 NOTE — Progress Notes (Signed)
PT Cancellation Note  Patient Details Name: Bruce Morgan MRN: [id number] DOB: [birth year]   Cancelled Treatment:    Reason Eval/Treat Not Completed: Medical issues which prohibited therapy (pt just had blood in urine, wants to talk to Dr before PT). Will follow.   [name] 02/26/2012, 2:29 PM [phone]

## 2012-02-26 NOTE — Progress Notes (Signed)
Cm spoke with patient with spouse at bedside concerning dc planning, pt to d/c home with IV ABX x 6wks. Per pt currently active with [facility] for HHPT. Per pt choice [facility] to provide [facility] for IV ABX. CM spoke with onsite weekend [employer] rep Lawson Fiscal at [phone] to alert agency of pt's dc plan. Face sheet, H/P, progress notes faxed to [place] at [phone]. Pt's spouse to assist with home care. Pt to resume HHPT upon d/c. No other needs stated.  Leonie Green [phone]

## 2012-02-26 NOTE — Op Note (Signed)
Bruce Morgan, Bruce NO.:  Morgan  Bruce RECORD NO.:  Morgan  LOCATION:  [id number]                         FACILITY:  [facility]  PHYSICIAN:  Bruce Morgan, M.D.    DATE OF BIRTH:  [birth year]  DATE OF PROCEDURE:  02/25/2012 DATE OF DISCHARGE:                              OPERATIVE REPORT   PREOPERATIVE DIAGNOSIS:  Right total hip arthroplasty, postop infection, acute, 2 weeks after procedure.  POSTOPERATIVE DIAGNOSIS:  Superficial postop right total hip arthroplasty, infection.  PROCEDURE:  Excisional debridement, right postop total hip arthroplasty. Placement of Stimulan antibiotic beads with vancomycin and tobramycin.  SURGEON:  Bruce Morgan, M.D.  ASSISTANT:  Bruce Morgan, M.D.  ANESTHESIA:  GOT.  EBL:  100 mL.  BLOOD:  Hemovac x2, right hip.  INDICATIONS:  This 72 year old male had total hip arthroplasty by Dr. Doneen Poisson on February 16, 2012, and was doing well until 2 days ago when he started having slight erythema of his hip.  He did have some tape blisters from the procedure and it was noted by the home health care nurse yesterday that his incision looks slightly red.  I spoke with the patient and his wife early today this morning.  He had no fever.  He had had slight clear serous drainage and then had some increased bloody drainage later today and then developed more erythema around his hip, increased pain, and a temperature of 100.7.  He was told to come to the hospital on emergent basis, seen emergently by me and white count was 13,000 when he was in the hospital.  At time of discharge, it was 11,000 and stable.  Hip was erythematous with serosanguineous drainage warm to touch and fluctuant.  He had been on aspirin for DVT prophylaxis and had been on early ambulation as well as SCDs when he was in the hospital.  The patient was afebrile greater than 101.  PROCEDURE:  After induction of general anesthesia, the  patient was placed on the [facility] table.  A thigh bolster was placed for the left lower extremity.  No Foley was placed.  The right hip had U-Drapes placed 10-15 inches and then Betadine solution was used for prepping. Split sheets, drapes, and then the large shower curtain Betadine drape was applied.  Time-out procedure was completed.  Staples were removed after time-out procedure.  As soon as the staple was removed at the proximal end of the incision, there were some purulent serosanguineous drainage consistent with infection, and aerobic and anaerobic cultures were obtained and labeled superficial right hip.  Superficial sutures were removed down to the level of the fascia and sharp excisional debridement was performed with scalpel, scissors, cup curette, and rongeur removing devitalized tissue and fascia.  The fascia of the tensor looked angry, was questionable, and 3 liters of saline was used for complete irrigation of the area.  On further debridement, sharp excisional repeated as described above.  After 3 liters of irrigation, the locking suture in the fascia was removed.  Blunt dissection down deep and inspection down deep down to the neck of the prosthesis showed some hematoma down deep, but the tissue looked normal.  There  was no purulence.  No evidence of fascial necrosis, no foul-smelling odor. Deep cultures were obtained, labeled deep culture right hip and then the hematoma was suctioned further looking at the tissue, all looked normal. The neck was normal.  Pulsatile lavage was used for total of 6 more liters for total of 9 liters for the whole case.  Continued irrigation and debridement of some areas of the fascia was performed, this appeared to be all superficial.  There was no evidence of deep infection. Stimulan beads were mixed with tobramycin and also vancomycin, and as soon as the superficial culture was obtained, Ancef was given prophylactically by the CRNA and then as  soon as that infused, vancomycin 1 g was given.  Stimulan beads were placed deep, some were placed superficial.  A Prolene suture was used to reclosure of the fascia.  Two Hemovac drains were placed.  Some of the Stimulan beads were placed in the subcutaneous tissue, subcutaneous reapproximation with some Prolene and then 2-0 nylon interrupted skin sutures were used followed by postop dressing.  The patient tolerated the procedure well. Instrument count and needle count were correct, and was transferred to the recovery room in stable condition.  He will receive Zosyn and vancomycin IV pending culture results.     Mackenzie Lia C. Ophelia Morgan, M.D.     MCY/MEDQ  D:  02/25/2012  T:  02/26/2012  Job:  [id number]

## 2012-02-27 ENCOUNTER — Encounter (HOSPITAL_COMMUNITY): Payer: Self-pay | Admitting: Orthopaedic Surgery

## 2012-02-27 LAB — CBC
MCHC: 34.1 g/dL (ref 30.0–36.0)
Platelets: 264 10*3/uL (ref 150–400)
RDW: 13 % (ref 11.5–15.5)
WBC: 13.2 10*3/uL — ABNORMAL HIGH (ref 4.0–10.5)

## 2012-02-27 LAB — BASIC METABOLIC PANEL
Chloride: 97 mEq/L (ref 96–112)
Creatinine, Ser: 1.09 mg/dL (ref 0.50–1.35)
GFR calc Af Amer: 77 mL/min — ABNORMAL LOW (ref >90)
GFR calc non Af Amer: 67 mL/min — ABNORMAL LOW (ref >90)
Potassium: 3.9 mEq/L (ref 3.5–5.1)

## 2012-02-27 MED ORDER — ASPIRIN EC 325 MG PO TBEC
325.0000 mg | DELAYED_RELEASE_TABLET | Freq: Every day | ORAL | Status: DC
  Administered 2012-02-28 – 2012-03-01 (×3): 325 mg via ORAL
  Filled 2012-02-27 (×3): qty 1

## 2012-02-27 NOTE — Progress Notes (Signed)
Physical Therapy Treatment Patient Details Name: Bruce Morgan MRN: [id number] DOB: Jul 16, 1940 Today's Date: 02/27/2012 Time: 1030-1055 PT Time Calculation (min): 25 min  PT Assessment / Plan / Recommendation Comments on Treatment Session  Pt doing well with mobility. He ambulated 400' with RW and performed standing hip exercises. Verbal cues for technique with standing exercises.     Follow Up Recommendations  Home health PT     Does the patient have the potential to tolerate intense rehabilitation     Barriers to Discharge        Equipment Recommendations  None recommended by PT    Recommendations for Other Services    Frequency 7X/week   Plan Frequency remains appropriate;Discharge plan remains appropriate    Precautions / Restrictions Precautions Precautions: None Precaution Comments: Direct Anterior THR Restrictions Weight Bearing Restrictions: No Other Position/Activity Restrictions: WBAT   Pertinent Vitals/Pain *"4/10 R hip Ice applied to R hip**    Mobility  Bed Mobility Details for Bed Mobility Assistance: min A for RLE into bed Transfers Transfers: Sit to Stand;Stand to Sit Sit to Stand: 6: Modified independent (Device/Increase time);From chair/3-in-1 Stand to Sit: 6: Modified independent (Device/Increase time);To chair/3-in-1 Ambulation/Gait Ambulation/Gait Assistance: 6: Modified independent (Device/Increase time) Ambulation Distance (Feet): 400 Feet Assistive device: Rolling walker Gait Pattern: Step-through pattern Gait velocity: WFL Stairs: No    Exercises Total Joint Exercises Ankle Circles/Pumps: AROM;10 reps;Supine;Both Quad Sets: AROM;Both;10 reps;Supine Hip ABduction/ADduction: AROM;Right;10 reps;Standing Long Arc Quad: AROM;Right;Seated;15 reps Marching in Standing: AROM;Right;10 reps;Standing Standing Hip Extension: AROM;Right;10 reps;Standing   PT Diagnosis:    PT Problem List:   PT Treatment Interventions:     PT Goals Acute Rehab  PT Goals PT Goal Formulation: With patient/family Time For Goal Achievement: 02/29/12 Potential to Achieve Goals: Good Pt will go Sit to Supine/Side: Independently Pt will Ambulate: with modified independence;Other (comment);with rolling walker ( greater than 300' ) PT Goal: Ambulate - Progress: Met Pt will Perform Home Exercise Program: Independently PT Goal: Perform Home Exercise Program - Progress: Progressing toward goal  Visit Information  Last PT Received On: 02/27/12 Assistance Needed: +1    Subjective Data  Subjective: I slept a bit better last night.  Patient Stated Goal: to get rid of this infection   Cognition  Overall Cognitive Status: Appears within functional limits for tasks assessed/performed Arousal/Alertness: Awake/alert Orientation Level: Appears intact for tasks assessed Behavior During Session: [facility] for tasks performed    Balance     End of Session PT - End of Session Activity Tolerance: Patient tolerated treatment well Patient left: with call bell/phone within reach;with family/visitor present;in chair Nurse Communication: Mobility status   GP     [name] 02/27/2012, 10:59 AM [phone]

## 2012-02-27 NOTE — Progress Notes (Signed)
LATE ENTRY: Patient called RN to room regarding saturated Aquacel bandage. RN assessed it to be related to a pulled out Hemovac drain. RN removed Aquacel bandage and cleaned area surrounding incision. Drain site not currently bleeding, so RN redressed site with gauze, ABD pad and paper tape. Told patient to call again if he noted further drainage. Will closely monitor. Kitty Cadavid [employer], [place] 02/27/2012 1:50 AM

## 2012-02-27 NOTE — Progress Notes (Signed)
Subjective: 2 Days Post-Op Procedure(s) (LRB): INCISION AND DRAINAGE (Right) Patient reports pain as mild.    Objective: Vital signs in last 24 hours: Temp:  [97.9 F (36.6 C)-99.7 F (37.6 C)] 97.9 F (36.6 C) (01/06 0420) Pulse Rate:  [71-74] 71  (01/06 0420) Resp:  [16-20] 20  (01/06 0420) BP: (114-134)/(63-71) 134/71 mmHg (01/06 0420) SpO2:  [94 %-96 %] 94 % (01/06 0420)  Intake/Output from previous day: 01/05 0701 - 01/06 0700 In: 1860 [P.O.:60; I.V.:1800] Out: 1440 [Urine:1350; Drains:90] Intake/Output this shift:     Basename 02/27/12 0421 02/26/12 0429 02/25/12 1830  HGB 10.1* 11.1* 11.9*    Basename 02/27/12 0421 02/26/12 0429  WBC 13.2* 16.7*  RBC 3.02* 3.35*  HCT 29.6* 33.0*  PLT 264 282    Basename 02/27/12 0421 02/25/12 1830  NA 133* 135  K 3.9 4.0  CL 97 98  CO2 28 27  BUN 15 20  CREATININE 1.09 1.11  GLUCOSE 119* 108*  CALCIUM 9.0 9.1   No results found for this basename: LABPT:2,INR:2 in the last 72 hours  Sensation intact distally Intact pulses distally Dorsiflexion/Plantar flexion intact Incision: moderate drainage Still quite red around incision.  Assessment/Plan: 2 Days Post-Op Procedure(s) (LRB): INCISION AND DRAINAGE (Right) Continue ABX therapy due to Post-op infection Await culture results  Bruce Morgan Y 02/27/2012, 7:16 AM

## 2012-02-28 LAB — CBC
HCT: 28.4 % — ABNORMAL LOW (ref 39.0–52.0)
Hemoglobin: 9.7 g/dL — ABNORMAL LOW (ref 13.0–17.0)
RDW: 12.9 % (ref 11.5–15.5)
WBC: 10.4 10*3/uL (ref 4.0–10.5)

## 2012-02-28 LAB — WOUND CULTURE

## 2012-02-28 LAB — BASIC METABOLIC PANEL
BUN: 12 mg/dL (ref 6–23)
Chloride: 98 mEq/L (ref 96–112)
GFR calc Af Amer: 79 mL/min — ABNORMAL LOW (ref >90)
GFR calc non Af Amer: 68 mL/min — ABNORMAL LOW (ref >90)
Potassium: 3.8 mEq/L (ref 3.5–5.1)
Sodium: 134 mEq/L — ABNORMAL LOW (ref 135–145)

## 2012-02-28 MED ORDER — SODIUM CHLORIDE 0.9 % IV SOLN
1750.0000 mg | Freq: Two times a day (BID) | INTRAVENOUS | Status: DC
  Administered 2012-02-28 – 2012-03-01 (×5): 1750 mg via INTRAVENOUS
  Filled 2012-02-28 (×6): qty 1750

## 2012-02-28 MED ORDER — FUROSEMIDE 10 MG/ML IJ SOLN
20.0000 mg | Freq: Once | INTRAMUSCULAR | Status: AC
  Administered 2012-02-28: 20 mg via INTRAVENOUS
  Filled 2012-02-28: qty 2

## 2012-02-28 MED ORDER — RIFAMPIN 300 MG PO CAPS
600.0000 mg | ORAL_CAPSULE | Freq: Every day | ORAL | Status: DC
  Administered 2012-02-28 – 2012-03-01 (×3): 600 mg via ORAL
  Filled 2012-02-28 (×3): qty 2

## 2012-02-28 NOTE — Progress Notes (Signed)
Subjective: 3 Days Post-Op Procedure(s) (LRB): INCISION AND DRAINAGE (Right) Patient reports pain as mild.  Gram stain grew out staph aureus. WBC down in normal.  Objective: Vital signs in last 24 hours: Temp:  [98.1 F (36.7 C)-99.7 F (37.6 C)] 99.7 F (37.6 C) (01/07 0556) Pulse Rate:  [74-78] 75  (01/07 0556) Resp:  [14-20] 18  (01/07 0556) BP: (134-141)/(63-71) 135/69 mmHg (01/07 0556) SpO2:  [94 %-98 %] 94 % (01/07 0556)  Intake/Output from previous day: 01/06 0701 - 01/07 0700 In: 2988.8 [P.O.:1100; I.V.:1788.8; IV Piggyback:100] Out: 2476 [Urine:2475; Stool:1] Intake/Output this shift:     Basename 02/28/12 0539 02/27/12 0421 02/26/12 0429 02/25/12 1830  HGB 9.7* 10.1* 11.1* 11.9*    Basename 02/28/12 0539 02/27/12 0421  WBC 10.4 13.2*  RBC 2.89* 3.02*  HCT 28.4* 29.6*  PLT 271 264    Basename 02/28/12 0539 02/27/12 0421  NA 134* 133*  K 3.8 3.9  CL 98 97  CO2 28 28  BUN 12 15  CREATININE 1.07 1.09  GLUCOSE 106* 119*  CALCIUM 9.0 9.0   No results found for this basename: LABPT:2,INR:2 in the last 72 hours  Incision: moderate drainage  Assessment/Plan: 3 Days Post-Op Procedure(s) (LRB): INCISION AND DRAINAGE (Right) Continue ABX therapy due to Post-op infection Awaiting final culture results prior to discharge so we can finalize antibiotics to send him home on.  Rosalie Gelpi Y 02/28/2012, 7:20 AM

## 2012-02-28 NOTE — Progress Notes (Signed)
Physical Therapy Treatment Patient Details Name: Bruce Morgan MRN: [id number] DOB: [birth year] Today's Date: 02/28/2012 Time: 4098-1191 PT Time Calculation (min): 29 min  PT Assessment / Plan / Recommendation Comments on Treatment Session  02/16/12 Direct Anterior THR that d/c to home but a few days later developed a fever/illness.  I&D done.  Pt progressing well with mobility but still c/o "not feeling well".    Follow Up Recommendations  Home health PT     Does the patient have the potential to tolerate intense rehabilitation     Barriers to Discharge        Equipment Recommendations  None recommended by PT    Recommendations for Other Services    Frequency 7X/week   Plan Discharge plan remains appropriate    Precautions / Restrictions Precautions Precautions: None Precaution Comments: Direct Anterior THR  Restrictions Weight Bearing Restrictions: No Other Position/Activity Restrictions: WBAT   Pertinent Vitals/Pain C/o "soreness"    Mobility  Bed Mobility Bed Mobility: Not assessed Details for Bed Mobility Assistance: Pt OOB in recliner Transfers Transfers: Sit to Stand;Stand to Sit Sit to Stand: 6: Modified independent (Device/Increase time);From chair/3-in-1;From toilet Stand to Sit: 6: Modified independent (Device/Increase time);To toilet;To chair/3-in-1 Details for Transfer Assistance: increased time Ambulation/Gait Ambulation/Gait Assistance: 6: Modified independent (Device/Increase time) Ambulation Distance (Feet): 400 Feet Assistive device: Rolling walker Ambulation/Gait Assistance Details: good alternating gait Gait Pattern: Step-through pattern Gait velocity: WFL     PT Goals                                                 progressing    Visit Information  Last PT Received On: 02/28/12 Assistance Needed: +1    Subjective Data      Cognition       Balance     End of Session PT - End of Session Equipment Utilized During Treatment: Gait  belt Activity Tolerance: Patient tolerated treatment well Patient left: in chair;with call bell/phone within reach;with family/visitor present   Felecia Shelling  PTA [facility]  Acute  Rehab Pager     [phone]

## 2012-02-28 NOTE — Progress Notes (Signed)
ANTIBIOTIC CONSULT NOTE - FOLLOW UP  Pharmacy Consult for Vancomycin Indication: MRSA infection s/p R THA  No Known Allergies  Patient Measurements: Height: 5\' 11"  (180.3 cm) Weight: 266 lb (120.657 kg) IBW/kg (Calculated) : 75.3   Vital Signs: Temp: 99.7 F (37.6 C) (01/07 0556) Temp src: Oral (01/07 0556) BP: 135/69 mmHg (01/07 0556) Pulse Rate: 75  (01/07 0556) Intake/Output from previous day: 01/06 0701 - 01/07 0700 In: 2988.8 [P.O.:1100; I.V.:1788.8; IV Piggyback:100] Out: 2476 [Urine:2475; Stool:1] Intake/Output from this shift:    Labs:  Basename 02/28/12 0539 02/27/12 0421 02/26/12 0429 02/25/12 1830  WBC 10.4 13.2* 16.7* --  HGB 9.7* 10.1* 11.1* --  PLT 271 264 282 --  LABCREA -- -- -- --  CREATININE 1.07 1.09 -- 1.11   Estimated Creatinine Clearance: 85 ml/min (by C-G formula based on Cr of 1.07).  Basename 02/28/12 0745  VANCOTROUGH 9.1*  VANCOPEAK --  Drue Dun --  GENTTROUGH --  GENTPEAK --  GENTRANDOM --  TOBRATROUGH --  TOBRAPEAK --  TOBRARND --  AMIKACINPEAK --  AMIKACINTROU --  AMIKACIN --    Assessment:  72 yr old male s/p right THA (02/16/12) presented 1/4 with c/o fever, pain, erythema and incisional drainage. I&D performed 1/4 with placement of antibiotic beads (Vanc/Tobra). Vanc and Zosyn ordered post-operatively.  Today is Day#4 of Vancomycin, MD discontinued Zosyn and added Rifampin since R hip synovial fluid and wound culture both growing MRSA.  Vancomycin trough this AM low at 9.1 on Vancomycin 1gm q12h.    Patient is afebrile, WBC improved to 10.4K, renal function stable.  Goal of Therapy:  Vancomycin trough level 15-20 mcg/ml  Plan:   Aggressively increase Vancomycin to 1750 mg IV q12h (aiming for trough of ~16)  Pharmacy will f/u  Geoffry Paradise, PharmD, BCPS Pager: [phone] 8:58 AM Pharmacy #: [phone]

## 2012-02-29 LAB — CBC
HCT: 28 % — ABNORMAL LOW (ref 39.0–52.0)
Hemoglobin: 9.5 g/dL — ABNORMAL LOW (ref 13.0–17.0)
MCH: 33 pg (ref 26.0–34.0)
MCHC: 33.9 g/dL (ref 30.0–36.0)
MCV: 97.2 fL (ref 78.0–100.0)

## 2012-02-29 LAB — BASIC METABOLIC PANEL
BUN: 10 mg/dL (ref 6–23)
CO2: 29 mEq/L (ref 19–32)
Calcium: 9.1 mg/dL (ref 8.4–10.5)
Creatinine, Ser: 1.02 mg/dL (ref 0.50–1.35)
GFR calc non Af Amer: 72 mL/min — ABNORMAL LOW (ref >90)
Glucose, Bld: 127 mg/dL — ABNORMAL HIGH (ref 70–99)

## 2012-02-29 NOTE — Progress Notes (Signed)
Subjective: 4 Days Post-Op Procedure(s) (LRB): INCISION AND DRAINAGE (Right) Patient reports pain as mild.  Feels much better overall.  WBC continues to trend downward.  Did grow out MRSA.  On vanc with adjusted dose by pharmacy.  Objective: Vital signs in last 24 hours: Temp:  [99 F (37.2 C)-99.3 F (37.4 C)] 99 F (37.2 C) (01/08 0618) Pulse Rate:  [66-83] 66  (01/08 0618) Resp:  [18-20] 18  (01/08 0618) BP: (136-141)/(63-96) 136/66 mmHg (01/08 0618) SpO2:  [94 %-97 %] 95 % (01/08 0618)  Intake/Output from previous day: 01/07 0701 - 01/08 0700 In: 1794.9 [P.O.:840; I.V.:454.9; IV Piggyback:500] Out: 3725 [Urine:3725] Intake/Output this shift:     Basename 02/29/12 0505 02/28/12 0539 02/27/12 0421  HGB 9.5* 9.7* 10.1*    Basename 02/29/12 0505 02/28/12 0539  WBC 7.6 10.4  RBC 2.88* 2.89*  HCT 28.0* 28.4*  PLT 304 271    Basename 02/29/12 0505 02/28/12 0539  NA 135 134*  K 3.7 3.8  CL 100 98  CO2 29 28  BUN 10 12  CREATININE 1.02 1.07  GLUCOSE 127* 106*  CALCIUM 9.1 9.0   No results found for this basename: LABPT:2,INR:2 in the last 72 hours  Incision: scant drainage Cellulitis improving.  Assessment/Plan: 4 Days Post-Op Procedure(s) (LRB): INCISION AND DRAINAGE (Right) Continue ABX therapy due to Post-op infection Plan for discharge tomorrow  Kathryne Hitch 02/29/2012, 7:07 AM

## 2012-02-29 NOTE — Progress Notes (Addendum)
Physical Therapy Treatment Patient Details Name: Bruce Morgan MRN: [id number] DOB: 12-Sep-1940 Today's Date: 02/29/2012 Time: 6213-0865 PT Time Calculation (min): 24 min  PT Assessment / Plan / Recommendation Comments on Treatment Session  Pt. is up ad lib, recommended pt use RW at DC until edema resides. Pt. has met goals. No further PT indicated in acute setting.    Follow Up Recommendations        Does the patient have the potential to tolerate intense rehabilitation     Barriers to Discharge        Equipment Recommendations  None recommended by PT    Recommendations for Other Services    Frequency     Plan All goals met and education completed, patient dischaged from PT services    Precautions / Restrictions Precautions Precautions: None   Pertinent Vitals/Pain Pt is slightly SOB    Mobility  Transfers Sit to Stand: 6: Modified independent (Device/Increase time) Stand to Sit: 6: Modified independent (Device/Increase time) Ambulation/Gait Ambulation/Gait Assistance: 6: Modified independent (Device/Increase time) Assistive device: Rolling walker Ambulation/Gait Assistance Details: Pt. is up ad lib to BR using IV pole. Pt. ambulating w/ RW in hall w/ family Gait Pattern: Step-through pattern  Distance 800 ft.  Exercises     PT Diagnosis:    PT Problem List:   PT Treatment Interventions:     PT Goals Acute Rehab PT Goals Pt will go Sit to Supine/Side: Independently PT Goal: Sit to Supine/Side - Progress: Met Pt will Ambulate: with modified independence;with rolling walker PT Goal: Ambulate - Progress: Met Pt will Perform Home Exercise Program: Independently PT Goal: Perform Home Exercise Program - Progress: Met  Visit Information  Last PT Received On: 02/29/12 Assistance Needed: +1    Subjective Data  Subjective: It does not hurt to walk, those exercises do.   Cognition  Overall Cognitive Status: Appears within functional limits for tasks  assessed/performed    Balance     End of Session PT - End of Session Activity Tolerance: Patient tolerated treatment well Patient left:  (up to BR)   GP     Rada Hay 02/29/2012, 11:30 AM [phone]

## 2012-02-29 NOTE — Progress Notes (Signed)
ANTIBIOTIC CONSULT NOTE - FOLLOW UP  Pharmacy Consult for Vancomycin Indication: MRSA infection s/p R THA  No Known Allergies  Patient Measurements: Height: 5\' 11"  (180.3 cm) Weight: 266 lb (120.657 kg) IBW/kg (Calculated) : 75.3   Vital Signs: Temp: 98.9 F (37.2 C) (01/08 1400) Temp src: Oral (01/08 1400) BP: 138/64 mmHg (01/08 1400) Pulse Rate: 69  (01/08 1400) Intake/Output from previous day: 01/07 0701 - 01/08 0700 In: 1794.9 [P.O.:840; I.V.:454.9; IV Piggyback:500] Out: 3725 [Urine:3725] Intake/Output from this shift: Total I/O In: -  Out: 300 [Urine:300]  Labs:  [facility] 02/29/12 0505 02/28/12 0539 02/27/12 0421  WBC 7.6 10.4 13.2*  HGB 9.5* 9.7* 10.1*  PLT 304 271 264  LABCREA -- -- --  CREATININE 1.02 1.07 1.09   Estimated Creatinine Clearance: 89.1 ml/min (by C-G formula based on Cr of 1.02).  Basename 02/29/12 2112 02/28/12 0745  VANCOTROUGH 17.1 9.1*  VANCOPEAK -- --  Drue Dun -- --  GENTTROUGH -- --  GENTPEAK -- --  GENTRANDOM -- --  TOBRATROUGH -- --  TOBRAPEAK -- --  TOBRARND -- --  AMIKACINPEAK -- --  AMIKACINTROU -- --  AMIKACIN -- --    Assessment:  72 yr old male s/p right THA (02/16/12) presented 1/4 with c/o fever, pain, erythema and incisional drainage. I&D performed 1/4 with placement of antibiotic beads (Vanc/Tobra). Vanc and Zosyn ordered post-operatively. MD discontinued Zosyn and added Rifampin 1/6 based on cx from R hip synovial fluid and wound culture - both growing MRSA.  Today is Day# 5 of Vancomycin - dose increased yesterday based on subtx trough  Vancomycin trough tx tonight on new dose   Patient is afebrile, WBC improved to 7.6K, Scr wnl/stable.  Goal of Therapy:  Vancomycin trough level 15-20 mcg/ml  Plan:   Continue Vancomycin to 1750 mg IV q12h   Follow labs, recheck VT as necessary, adjust dose as appropriate  Gwen Her PharmD  [phone] 02/29/2012 9:51 PM

## 2012-02-29 NOTE — Care Management Note (Signed)
    Page 1 of 2   02/29/2012     10:33:23 AM   CARE MANAGEMENT NOTE 02/29/2012  Patient:  Bruce Morgan, Bruce Morgan   Account Number:  [id number]  Date Initiated:  02/26/2012  Documentation initiated by:  DAVIS,TYMEEKA  Subjective/Objective Assessment:   72 yo male admitted s/p right total hip arthroplasty. PTA pt lived home with spouse.     Action/Plan:   Home with HH services, IV ABX for 6 weeks   Anticipated DC Date:  03/01/2012   Anticipated DC Plan:  HOME W HOME HEALTH SERVICES  In-house referral  NA      DC Planning Services  CM consult      [facility] Choice  HOME HEALTH  DURABLE MEDICAL EQUIPMENT   Choice offered to / List presented to:  [name]   DME arranged  Levan Hurst      DME agency  [facility]     [facility] arranged  HH-2 PT  HH-1 RN      [facility] agency  [facility]   Status of service:  Completed, signed off Medicare Important Message given?   (If response is "NO", the following Medicare IM given date fields will be blank) Date Medicare IM given:   Date Additional Medicare IM given:    Discharge Disposition:  HOME W HOME HEALTH SERVICES  Per UR Regulation:  Reviewed for med. necessity/level of care/duration of stay  If discussed at Long Length of Stay Meetings, dates discussed:    Comments:  02/26/12 1617 Tymeeka Davis,RN,BSN [phone] Cm spoke with patient with spouse at bedside concerning dc planning, pt to d/c home with IV ABX x 6wks. Per pt currently active with [facility] for HHPT. Per pt choice [facility] to provide [facility] for IV ABX. CM spoke with onsite weekend [place] rep Lawson Fiscal at [phone] to alert agency of pt's dc plan. Face sheet, H/P, progress notes faxed to [place] at [phone]. Pt's spouse to assist with home care. Pt to resume HHPT upon d/c. No other needs stated.

## 2012-03-01 LAB — CBC
HCT: 28.5 % — ABNORMAL LOW (ref 39.0–52.0)
MCH: 33 pg (ref 26.0–34.0)
MCV: 99 fL (ref 78.0–100.0)
Platelets: 310 10*3/uL (ref 150–400)
RDW: 12.7 % (ref 11.5–15.5)

## 2012-03-01 LAB — BASIC METABOLIC PANEL
BUN: 8 mg/dL (ref 6–23)
CO2: 29 mEq/L (ref 19–32)
Calcium: 9.1 mg/dL (ref 8.4–10.5)
Creatinine, Ser: 1.03 mg/dL (ref 0.50–1.35)
GFR calc Af Amer: 83 mL/min — ABNORMAL LOW (ref >90)

## 2012-03-01 LAB — ANAEROBIC CULTURE

## 2012-03-01 MED ORDER — VANCOMYCIN HCL 10 G IV SOLR: 1750.0000 mg | Freq: Two times a day (BID) | INTRAVENOUS | Status: DC

## 2012-03-01 MED ORDER — HEPARIN SOD (PORK) LOCK FLUSH 100 UNIT/ML IV SOLN
250.0000 [IU] | INTRAVENOUS | Status: AC | PRN
  Administered 2012-03-01: 500 [IU]

## 2012-03-01 MED ORDER — METHOCARBAMOL 500 MG PO TABS: 500.0000 mg | ORAL_TABLET | Freq: Four times a day (QID) | ORAL | Status: DC | PRN

## 2012-03-01 MED ORDER — ASPIRIN 325 MG PO TBEC: 325.0000 mg | DELAYED_RELEASE_TABLET | Freq: Every day | ORAL | Status: DC

## 2012-03-01 MED ORDER — RIFAMPIN 300 MG PO CAPS: 600.0000 mg | ORAL_CAPSULE | Freq: Every day | ORAL | Status: DC

## 2012-03-01 MED ORDER — OXYCODONE-ACETAMINOPHEN 5-325 MG PO TABS: 1.0000 | ORAL_TABLET | ORAL | Status: AC | PRN

## 2012-03-01 NOTE — Progress Notes (Signed)
Patient ID: Bruce Morgan, male   DOB: Jul 09, 1940, 71 y.o.   MRN: [id number] Looks good today.  Improving overall.  Can d/c to home.  Home IV Vanc for 5 weeks.

## 2012-03-01 NOTE — Progress Notes (Addendum)
Pharmacy: Vancomycin   72 yo M Currently on D#6 Vancomycin, increased to 1750 mg IV Q12h on 1/7.  Vancomycin trough (17.1) was therapeutic on 1/8 on this dose.    1.) Recommend for home health to repeat vancomycin trough on Friday 1/10 prior to the evening dose, goal trough 15-20.   2.) Would repeat Scr next week as well, and vancomycin troughs as needed according to dose changes.   Clydene Fake PharmD Pager #: [phone] 10:09 AM 03/01/2012

## 2012-03-01 NOTE — Discharge Summary (Signed)
Patient ID: Bruce Morgan MRN: [id number] DOB/AGE: 1941-02-13 72 y.o.  Admit date: 02/25/2012 Discharge date: 03/01/2012  Admission Diagnoses:  Active Problems:  Infection of prosthetic total hip joint   Discharge Diagnoses:  Same  Past Medical History  Diagnosis Date  . Hypertension   . Nocturia     3 TO 4 TIMES EVERY NIGHT  . Arthritis     Surgeries: Procedure(s): INCISION AND DRAINAGE on 02/25/2012   Consultants:    Discharged Condition: Improved  Hospital Course: Evyn Putzier is an 72 y.o. male who was admitted 02/25/2012 for operative treatment of<principal problem not specified>. Patient has severe unremitting pain that affects sleep, daily activities, and work/hobbies. After pre-op clearance the patient was taken to the operating room on 02/25/2012 and underwent  Procedure(s): INCISION AND DRAINAGE.    Patient was given perioperative antibiotics: Anti-infectives     Start     Dose/Rate Route Frequency Ordered Stop   03/01/12 0000   sodium chloride 0.9 % SOLN 500 mL with vancomycin 10 G SOLR 1,750 mg        1,750 mg 250 mL/hr over 120 Minutes Intravenous Every 12 hours 03/01/12 0626     02/28/12 1000   rifampin (RIFADIN) capsule 600 mg        600 mg Oral Daily 02/28/12 0712     02/28/12 1000   vancomycin (VANCOCIN) 1,750 mg in sodium chloride 0.9 % 500 mL IVPB        1,750 mg 250 mL/hr over 120 Minutes Intravenous Every 12 hours 02/28/12 0859     02/26/12 0800   vancomycin (VANCOCIN) 1,000 mg in sodium chloride 0.9 % 250 mL IVPB  Status:  Discontinued        1,000 mg 250 mL/hr over 60 Minutes Intravenous 2 times daily 02/25/12 1936 02/25/12 2255   02/26/12 0800   vancomycin (VANCOCIN) IVPB 1000 mg/200 mL premix  Status:  Discontinued        1,000 mg 200 mL/hr over 60 Minutes Intravenous Every 12 hours 02/25/12 2336 02/28/12 0843   02/25/12 2359   piperacillin-tazobactam (ZOSYN) IVPB 3.375 g  Status:  Discontinued        3.375 g 12.5 mL/hr over 240 Minutes  Intravenous Every 8 hours 02/25/12 2336 02/27/12 1829   02/25/12 2028   vancomycin (VANCOCIN) powder  Status:  Discontinued          As needed 02/25/12 2029 02/25/12 2116   02/25/12 2027   tobramycin (NEBCIN) powder  Status:  Discontinued          As needed 02/25/12 2028 02/25/12 2116   02/25/12 2000   vancomycin (VANCOCIN) 1,000 mg in sodium chloride 0.9 % 250 mL IVPB  Status:  Discontinued        1,000 mg 250 mL/hr over 60 Minutes Intravenous 2 times daily 02/25/12 1930 02/25/12 1936   02/25/12 2000   vancomycin (VANCOCIN) 1,500 mg in sodium chloride 0.9 % 500 mL IVPB  Status:  Discontinued        1,500 mg 250 mL/hr over 120 Minutes Intravenous 60 min pre-op 02/25/12 1936 02/25/12 2255   02/25/12 2000   ceFAZolin (ANCEF) 3 g in dextrose 5 % 50 mL IVPB  Status:  Discontinued        3 g 160 mL/hr over 30 Minutes Intravenous 60 min pre-op 02/25/12 1939 02/25/12 2253   02/25/12 1945   ceFAZolin (ANCEF) IVPB 2 g/50 mL premix  Status:  Discontinued        2  g 100 mL/hr over 30 Minutes Intravenous  Once 02/25/12 1932 02/25/12 1937           Patient was given sequential compression devices, early ambulation, and chemoprophylaxis to prevent DVT.  Patient benefited maximally from hospital stay and there were no complications.    Recent vital signs: Patient Vitals for the past 24 hrs:  BP Temp Temp src Pulse Resp SpO2  03/01/12 0602 157/74 mmHg 99.4 F (37.4 C) Oral 70  20  94 %  Mar 20, 2012 2211 159/84 mmHg 99.7 F (37.6 C) Oral 80  20  94 %  2012-03-20 1400 138/64 mmHg 98.9 F (37.2 C) Oral 69  18  95 %     Recent laboratory studies:  Basename 03/01/12 0530 03/20/12 0505 02/28/12 0539  WBC 7.2 7.6 --  HGB 9.5* 9.5* --  HCT 28.5* 28.0* --  PLT 310 304 --  NA -- 135 134*  K -- 3.7 3.8  CL -- 100 98  CO2 -- 29 28  BUN -- 10 12  CREATININE -- 1.02 1.07  GLUCOSE -- 127* 106*  INR -- -- --  CALCIUM -- 9.1 --     Discharge Medications:     Medication List     As of  03/01/2012  6:27 AM    STOP taking these medications         naproxen sodium 220 MG tablet   Commonly known as: ANAPROX      TAKE these medications         aspirin 325 MG EC tablet   Take 1 tablet (325 mg total) by mouth daily.      atorvastatin 20 MG tablet   Commonly known as: LIPITOR   Take 10 mg by mouth at bedtime.      Co Q 10 100 MG Caps   Take 1 capsule by mouth daily.      diltiazem 240 MG 24 hr capsule   Commonly known as: CARDIZEM CD   Take 240 mg by mouth every morning.      doxazosin 8 MG tablet   Commonly known as: CARDURA   Take 8 mg by mouth at bedtime.      methocarbamol 500 MG tablet   Commonly known as: ROBAXIN   Take 1 tablet (500 mg total) by mouth every 6 (six) hours as needed.      metoprolol 200 MG 24 hr tablet   Commonly known as: TOPROL-XL   Take 100 mg by mouth at bedtime.      MSM 1000 MG Caps   Take 1 capsule by mouth daily.      OVER THE COUNTER MEDICATION   Take 1 capsule by mouth daily. Omega 3 krill oil  daily      OVER THE COUNTER MEDICATION   Reumofan Plus  One daily      oxyCODONE-acetaminophen 5-325 MG per tablet   Commonly known as: PERCOCET/ROXICET   Take 1-2 tablets by mouth every 4 (four) hours as needed.      polyethylene glycol packet   Commonly known as: MIRALAX / GLYCOLAX   Take 17 g by mouth daily.      saw palmetto 80 MG capsule   Take 160 mg by mouth daily.      sodium chloride 0.9 % SOLN 500 mL with vancomycin 10 G SOLR 1,750 mg   Inject 1,750 mg into the vein every 12 (twelve) hours.      vitamin B-12 1000 MCG tablet   Commonly  known as: CYANOCOBALAMIN   Take 1,000 mcg by mouth daily.        Diagnostic Studies: Dg Chest 1 View  02/25/2012  *RADIOLOGY REPORT*  Clinical Data:   preop right hip infection  CHEST - 1 VIEW  Comparison: 02/07/2012  Findings: The lungs are clear without evidence of infiltrate or effusion.  Negative for heart failure.  Mild eventration of the right hemidiaphragm is unchanged.   IMPRESSION: No acute abnormality.   Original Report Authenticated By: Janeece Riggers, M.D.    Dg Chest 2 View  02/07/2012  *RADIOLOGY REPORT*  Clinical Data: Preop right total hip.  Hypertension.  CHEST - 2 VIEW  Comparison: None  Findings: Heart is normal size.  Linear scarring or atelectasis in the lung bases bilaterally.  Eventration of the hemidiaphragms bilaterally.  No effusions.  No acute bony abnormality. Degenerative spurring throughout the thoracic spine.  IMPRESSION: Bibasilar atelectasis or scarring.   Original Report Authenticated By: Charlett Nose, M.D.    Dg Hip Complete Right  02/16/2012  *RADIOLOGY REPORT*  Clinical Data: Right hip arthroplasty.  RIGHT HIP - COMPLETE 2+ VIEW  Comparison: None  Findings: The femoral and acetabular components are well seated. No complicating features are demonstrated.  IMPRESSION: Well seated components of a total knee arthroplasty.   Original Report Authenticated By: Rudie Meyer, M.D.    Dg Pelvis Portable  02/16/2012  *RADIOLOGY REPORT*  Clinical Data: Postop right hip surgery  PORTABLE PELVIS  Comparison: None.  Findings: Patient is status post right total hip arthroplasty. Satisfactory position and alignment.  IMPRESSION:   As above.   Original Report Authenticated By: Davonna Belling, M.D.    Dg Hip Portable 1 View Right  02/16/2012  *RADIOLOGY REPORT*  Clinical Data: Right hip surgery  PORTABLE RIGHT HIP - 1 VIEW  Comparison: Multiple priors.  Findings: Cross-table lateral demonstrates satisfactory position and alignment status post right total hip arthroplasty.  IMPRESSION: As above.   Original Report Authenticated By: Davonna Belling, M.D.    Dg C-arm 61-120 Min-no Report  02/16/2012  CLINICAL DATA: intra op   C-ARM 61-120 MINUTES  Fluoroscopy was utilized by the requesting physician.  No radiographic  interpretation.      Disposition: 06-Home-Health Care Svc      Discharge Orders    Future Orders Please Complete By Expires   Discharge patient          Follow-up Information    Follow up with Kathryne Hitch, MD. In 2 weeks.   Contact information:   [address] [place] [address] [phone]           Signed: Kathryne Hitch 03/01/2012, 6:27 AM

## 2012-11-08 ENCOUNTER — Ambulatory Visit
Admission: RE | Admit: 2012-11-08 | Discharge: 2012-11-08 | Disposition: A | Discharge status: Home / Self Care | Payer: Medicare Other | Source: Ambulatory Visit | Attending: Orthopedic Surgery | Admitting: Orthopedic Surgery

## 2012-11-08 ENCOUNTER — Other Ambulatory Visit: Payer: Self-pay | Admitting: Orthopedic Surgery

## 2012-11-08 DIAGNOSIS — M25512 Pain in left shoulder: Secondary | ICD-10-CM

## 2012-11-13 ENCOUNTER — Other Ambulatory Visit: Payer: Self-pay | Admitting: Orthopedic Surgery

## 2012-11-27 ENCOUNTER — Encounter (HOSPITAL_COMMUNITY): Payer: Self-pay | Admitting: Pharmacy Technician

## 2012-11-28 ENCOUNTER — Encounter (HOSPITAL_COMMUNITY): Payer: Self-pay

## 2012-11-28 ENCOUNTER — Encounter (HOSPITAL_COMMUNITY)
Admission: RE | Admit: 2012-11-28 | Discharge: 2012-11-28 | Disposition: A | Discharge status: Home / Self Care | Payer: Medicare Other | Source: Ambulatory Visit | Attending: Orthopedic Surgery | Admitting: Orthopedic Surgery

## 2012-11-28 DIAGNOSIS — Z01812 Encounter for preprocedural laboratory examination: Secondary | ICD-10-CM | POA: Insufficient documentation

## 2012-11-28 HISTORY — DX: Dizziness and giddiness: R42

## 2012-11-28 LAB — TYPE AND SCREEN
ABO/RH(D): O POS
Antibody Screen: NEGATIVE

## 2012-11-28 LAB — URINALYSIS, ROUTINE W REFLEX MICROSCOPIC
Bilirubin Urine: NEGATIVE
Glucose, UA: NEGATIVE mg/dL
Ketones, ur: NEGATIVE mg/dL
Nitrite: NEGATIVE
Protein, ur: NEGATIVE mg/dL
pH: 5 (ref 5.0–8.0)

## 2012-11-28 LAB — CBC WITH DIFFERENTIAL/PLATELET
Eosinophils Absolute: 0.3 10*3/uL (ref 0.0–0.7)
Eosinophils Relative: 6 % — ABNORMAL HIGH (ref 0–5)
HCT: 37.2 % — ABNORMAL LOW (ref 39.0–52.0)
Lymphocytes Relative: 33 % (ref 12–46)
Lymphs Abs: 1.8 10*3/uL (ref 0.7–4.0)
MCH: 31.9 pg (ref 26.0–34.0)
MCV: 95.6 fL (ref 78.0–100.0)
Monocytes Absolute: 0.8 10*3/uL (ref 0.1–1.0)
Platelets: 209 10*3/uL (ref 150–400)
RBC: 3.89 MIL/uL — ABNORMAL LOW (ref 4.22–5.81)
WBC: 5.5 10*3/uL (ref 4.0–10.5)

## 2012-11-28 LAB — COMPREHENSIVE METABOLIC PANEL
Albumin: 4 g/dL (ref 3.5–5.2)
BUN: 16 mg/dL (ref 6–23)
Calcium: 9.4 mg/dL (ref 8.4–10.5)
Creatinine, Ser: 1.17 mg/dL (ref 0.50–1.35)
GFR calc Af Amer: 71 mL/min — ABNORMAL LOW (ref >90)
Total Bilirubin: 0.3 mg/dL (ref 0.3–1.2)
Total Protein: 7.4 g/dL (ref 6.0–8.3)

## 2012-11-28 LAB — SURGICAL PCR SCREEN: MRSA, PCR: NEGATIVE

## 2012-11-28 LAB — PROTIME-INR
INR: 0.9 (ref 0.00–1.49)
Prothrombin Time: 12 seconds (ref 11.6–15.2)

## 2012-11-28 NOTE — Progress Notes (Signed)
11/28/12 0955  OBSTRUCTIVE SLEEP APNEA  Have you ever been diagnosed with sleep apnea through a sleep study? No  Do you snore loudly (loud enough to be heard through closed doors)?  1  Do you often feel tired, fatigued, or sleepy during the daytime? 1  Has anyone observed you stop breathing during your sleep? 1  Do you have, or are you being treated for high blood pressure? 1  BMI more than 35 kg/m2? 1  Age over 70 years old? 1  Neck circumference greater than 40 cm/18 inches? 1  Gender: 1  Obstructive Sleep Apnea Score 8  Score 4 or greater  Results sent to PCP

## 2012-11-28 NOTE — Progress Notes (Signed)
Primary physician - dr. Alver Fisher Does not have a cardiologist ekg in dec 2013. No other cardiac testing

## 2012-11-28 NOTE — Pre-Procedure Instructions (Signed)
Bruce Morgan  11/28/2012   Your procedure is scheduled on:  Thursday, October 16th  Report to Main Entrance "A" at 0530 AM.  Call this number if you have problems the morning of surgery: [phone]   Remember:   Do not eat food or drink liquids after midnight.   Take these medicines the morning of surgery with A SIP OF WATER: diltiazem, vicodin if needed  Stop taking over the counter vitamins/herbal medications, NSAIDS 5 days prior to surgery.   Do not wear jewelry.  Do not wear lotions, powders, or perfumes. You may wear deodorant.  Do not shave 48 hours prior to surgery. Men may shave face and neck.  Do not bring valuables to the hospital.  [facility] is not responsible for any belongings or valuables.               Contacts, dentures or bridgework may not be worn into surgery.  Leave suitcase in the car. After surgery it may be brought to your room.  For patients admitted to the hospital, discharge time is determined by your  treatment team.               Patients discharged the day of surgery will not be allowed to drive home.   Special Instructions: Shower using CHG 2 nights before surgery and the night before surgery.  If you shower the day of surgery use CHG.  Use special wash - you have one bottle of CHG for all showers.  You should use approximately 1/3 of the bottle for each shower.   Please read over the following fact sheets that you were given: Pain Booklet, Coughing and Deep Breathing, Blood Transfusion Information, MRSA Information and Surgical Site Infection Prevention

## 2012-12-05 MED ORDER — CEFAZOLIN SODIUM-DEXTROSE 2-3 GM-% IV SOLR
2.0000 g | INTRAVENOUS | Status: AC
  Administered 2012-12-06: 2 g via INTRAVENOUS
  Filled 2012-12-05: qty 50

## 2012-12-05 MED ORDER — POVIDONE-IODINE 7.5 % EX SOLN
Freq: Once | CUTANEOUS | Status: DC
  Filled 2012-12-05: qty 118

## 2012-12-06 ENCOUNTER — Encounter (HOSPITAL_COMMUNITY)
Admission: RE | Disposition: A | Discharge status: Home / Self Care | Payer: Self-pay | Source: Ambulatory Visit | Attending: Orthopedic Surgery

## 2012-12-06 ENCOUNTER — Encounter (HOSPITAL_COMMUNITY): Payer: Self-pay | Admitting: *Deleted

## 2012-12-06 ENCOUNTER — Encounter (HOSPITAL_COMMUNITY): Payer: Medicare Other | Admitting: Anesthesiology

## 2012-12-06 ENCOUNTER — Inpatient Hospital Stay (HOSPITAL_COMMUNITY): Payer: Medicare Other

## 2012-12-06 ENCOUNTER — Inpatient Hospital Stay (HOSPITAL_COMMUNITY)
Admission: RE | Admit: 2012-12-06 | Discharge: 2012-12-07 | DRG: 483 | Disposition: A | Discharge status: Home / Self Care | Payer: Medicare Other | Source: Ambulatory Visit | Attending: Orthopedic Surgery | Admitting: Orthopedic Surgery

## 2012-12-06 ENCOUNTER — Ambulatory Visit (HOSPITAL_COMMUNITY): Payer: Medicare Other | Admitting: Anesthesiology

## 2012-12-06 DIAGNOSIS — M19019 Primary osteoarthritis, unspecified shoulder: Principal | ICD-10-CM | POA: Diagnosis present

## 2012-12-06 DIAGNOSIS — M19012 Primary osteoarthritis, left shoulder: Secondary | ICD-10-CM

## 2012-12-06 DIAGNOSIS — I10 Essential (primary) hypertension: Secondary | ICD-10-CM | POA: Diagnosis present

## 2012-12-06 DIAGNOSIS — Z8614 Personal history of Methicillin resistant Staphylococcus aureus infection: Secondary | ICD-10-CM

## 2012-12-06 DIAGNOSIS — Z96649 Presence of unspecified artificial hip joint: Secondary | ICD-10-CM

## 2012-12-06 HISTORY — PX: TOTAL SHOULDER ARTHROPLASTY: SHX126

## 2012-12-06 HISTORY — PX: SHOULDER INJECTION: SHX5048

## 2012-12-06 SURGERY — ARTHROPLASTY, SHOULDER, TOTAL
Anesthesia: General | Site: Shoulder | Laterality: Right | Wound class: Clean

## 2012-12-06 MED ORDER — HYDROMORPHONE HCL PF 1 MG/ML IJ SOLN
0.2500 mg | INTRAMUSCULAR | Status: DC | PRN
  Administered 2012-12-06 (×2): 0.5 mg via INTRAVENOUS

## 2012-12-06 MED ORDER — ROCURONIUM BROMIDE 100 MG/10ML IV SOLN
INTRAVENOUS | Status: DC | PRN
  Administered 2012-12-06: 50 mg via INTRAVENOUS

## 2012-12-06 MED ORDER — EPHEDRINE SULFATE 50 MG/ML IJ SOLN
INTRAMUSCULAR | Status: DC | PRN
  Administered 2012-12-06: 5 mg via INTRAVENOUS
  Administered 2012-12-06 (×2): 2.5 mg via INTRAVENOUS
  Administered 2012-12-06: 10 mg via INTRAVENOUS

## 2012-12-06 MED ORDER — ONDANSETRON HCL 4 MG/2ML IJ SOLN
INTRAMUSCULAR | Status: DC | PRN
  Administered 2012-12-06: 4 mg via INTRAMUSCULAR

## 2012-12-06 MED ORDER — HYDROMORPHONE HCL PF 1 MG/ML IJ SOLN
INTRAMUSCULAR | Status: AC
  Administered 2012-12-06: 0.5 mg via INTRAVENOUS
  Filled 2012-12-06: qty 1

## 2012-12-06 MED ORDER — GLYCOPYRROLATE 0.2 MG/ML IJ SOLN
INTRAMUSCULAR | Status: DC | PRN
  Administered 2012-12-06: .3 mg via INTRAVENOUS

## 2012-12-06 MED ORDER — FLEET ENEMA 7-19 GM/118ML RE ENEM: 1.0000 | ENEMA | Freq: Once | RECTAL | Status: AC | PRN

## 2012-12-06 MED ORDER — OXYCODONE-ACETAMINOPHEN 5-325 MG PO TABS
1.0000 | ORAL_TABLET | ORAL | Status: DC | PRN
  Administered 2012-12-06 – 2012-12-07 (×2): 2 via ORAL
  Filled 2012-12-06 (×2): qty 2

## 2012-12-06 MED ORDER — NEOSTIGMINE METHYLSULFATE 1 MG/ML IJ SOLN
INTRAMUSCULAR | Status: DC | PRN
  Administered 2012-12-06: 2.5 mg via INTRAVENOUS

## 2012-12-06 MED ORDER — ARTIFICIAL TEARS OP OINT
TOPICAL_OINTMENT | OPHTHALMIC | Status: DC | PRN
  Administered 2012-12-06: 1 via OPHTHALMIC

## 2012-12-06 MED ORDER — TRIAMCINOLONE ACETONIDE 40 MG/ML IJ SUSP
INTRAMUSCULAR | Status: AC
  Filled 2012-12-06: qty 5

## 2012-12-06 MED ORDER — OXYCODONE HCL 5 MG/5ML PO SOLN: 5.0000 mg | Freq: Once | ORAL | Status: DC | PRN

## 2012-12-06 MED ORDER — METOPROLOL SUCCINATE ER 100 MG PO TB24
100.0000 mg | ORAL_TABLET | Freq: Every day | ORAL | Status: DC
Start: 2012-12-07 — End: 2012-12-07
  Filled 2012-12-06: qty 1

## 2012-12-06 MED ORDER — ALUMINUM HYDROXIDE GEL 320 MG/5ML PO SUSP
15.0000 mL | ORAL | Status: DC | PRN
  Filled 2012-12-06: qty 30

## 2012-12-06 MED ORDER — OXYCODONE HCL 5 MG PO TABS: 5.0000 mg | ORAL_TABLET | ORAL | Status: DC | PRN

## 2012-12-06 MED ORDER — DILTIAZEM HCL ER COATED BEADS 240 MG PO CP24
240.0000 mg | ORAL_CAPSULE | Freq: Every day | ORAL | Status: DC
  Filled 2012-12-06: qty 1

## 2012-12-06 MED ORDER — BUPIVACAINE HCL 0.25 % IJ SOLN
INTRAMUSCULAR | Status: DC | PRN
  Administered 2012-12-06: 6 mL via INTRA_ARTICULAR

## 2012-12-06 MED ORDER — PROPOFOL 10 MG/ML IV BOLUS
INTRAVENOUS | Status: DC | PRN
  Administered 2012-12-06: 160 mg via INTRAVENOUS

## 2012-12-06 MED ORDER — MENTHOL 3 MG MT LOZG: 1.0000 | LOZENGE | OROMUCOSAL | Status: DC | PRN

## 2012-12-06 MED ORDER — ASPIRIN EC 325 MG PO TBEC
325.0000 mg | DELAYED_RELEASE_TABLET | Freq: Two times a day (BID) | ORAL | Status: DC
  Administered 2012-12-06 – 2012-12-07 (×2): 325 mg via ORAL
  Filled 2012-12-06 (×4): qty 1

## 2012-12-06 MED ORDER — DOCUSATE SODIUM 100 MG PO CAPS
100.0000 mg | ORAL_CAPSULE | Freq: Two times a day (BID) | ORAL | Status: DC
  Administered 2012-12-06 – 2012-12-07 (×2): 100 mg via ORAL
  Filled 2012-12-06 (×3): qty 1

## 2012-12-06 MED ORDER — HYDROCODONE-ACETAMINOPHEN 5-325 MG PO TABS: 1.0000 | ORAL_TABLET | ORAL | Status: DC | PRN

## 2012-12-06 MED ORDER — SODIUM CHLORIDE 0.9 % IV SOLN
INTRAVENOUS | Status: DC
  Administered 2012-12-06 – 2012-12-07 (×2): via INTRAVENOUS

## 2012-12-06 MED ORDER — ONDANSETRON HCL 4 MG PO TABS: 4.0000 mg | ORAL_TABLET | Freq: Four times a day (QID) | ORAL | Status: DC | PRN

## 2012-12-06 MED ORDER — POLYETHYLENE GLYCOL 3350 17 G PO PACK: 17.0000 g | PACK | Freq: Every day | ORAL | Status: DC | PRN

## 2012-12-06 MED ORDER — METOCLOPRAMIDE HCL 5 MG/ML IJ SOLN: 5.0000 mg | Freq: Three times a day (TID) | INTRAMUSCULAR | Status: DC | PRN

## 2012-12-06 MED ORDER — ACETAMINOPHEN 650 MG RE SUPP: 650.0000 mg | Freq: Four times a day (QID) | RECTAL | Status: DC | PRN

## 2012-12-06 MED ORDER — DOXAZOSIN MESYLATE 8 MG PO TABS
8.0000 mg | ORAL_TABLET | Freq: Every day | ORAL | Status: DC
  Administered 2012-12-06: 8 mg via ORAL
  Filled 2012-12-06 (×2): qty 1

## 2012-12-06 MED ORDER — BISACODYL 10 MG RE SUPP: 10.0000 mg | Freq: Every day | RECTAL | Status: DC | PRN

## 2012-12-06 MED ORDER — DIPHENHYDRAMINE HCL 12.5 MG/5ML PO ELIX: 12.5000 mg | ORAL_SOLUTION | ORAL | Status: DC | PRN

## 2012-12-06 MED ORDER — ACETAMINOPHEN 325 MG PO TABS: 650.0000 mg | ORAL_TABLET | Freq: Four times a day (QID) | ORAL | Status: DC | PRN

## 2012-12-06 MED ORDER — LIDOCAINE HCL (CARDIAC) 20 MG/ML IV SOLN
INTRAVENOUS | Status: DC | PRN
  Administered 2012-12-06: 50 mg via INTRAVENOUS

## 2012-12-06 MED ORDER — ONDANSETRON HCL 4 MG/2ML IJ SOLN: 4.0000 mg | Freq: Four times a day (QID) | INTRAMUSCULAR | Status: DC | PRN

## 2012-12-06 MED ORDER — BUPIVACAINE HCL (PF) 0.25 % IJ SOLN
INTRAMUSCULAR | Status: AC
  Filled 2012-12-06: qty 10

## 2012-12-06 MED ORDER — PHENYLEPHRINE HCL 10 MG/ML IJ SOLN
10.0000 mg | INTRAVENOUS | Status: DC | PRN
  Administered 2012-12-06: 25 ug/min via INTRAVENOUS

## 2012-12-06 MED ORDER — PHENOL 1.4 % MT LIQD: 1.0000 | OROMUCOSAL | Status: DC | PRN

## 2012-12-06 MED ORDER — CEFAZOLIN SODIUM-DEXTROSE 2-3 GM-% IV SOLR
2.0000 g | Freq: Four times a day (QID) | INTRAVENOUS | Status: AC
  Administered 2012-12-06 – 2012-12-07 (×3): 2 g via INTRAVENOUS
  Filled 2012-12-06 (×3): qty 50

## 2012-12-06 MED ORDER — OXYCODONE-ACETAMINOPHEN 5-325 MG PO TABS: 1.0000 | ORAL_TABLET | ORAL | Status: DC | PRN

## 2012-12-06 MED ORDER — PROMETHAZINE HCL 25 MG/ML IJ SOLN: 6.2500 mg | INTRAMUSCULAR | Status: DC | PRN

## 2012-12-06 MED ORDER — OXYCODONE HCL 5 MG PO TABS: 5.0000 mg | ORAL_TABLET | Freq: Once | ORAL | Status: DC | PRN

## 2012-12-06 MED ORDER — FENTANYL CITRATE 0.05 MG/ML IJ SOLN
INTRAMUSCULAR | Status: DC | PRN
  Administered 2012-12-06 (×3): 25 ug via INTRAVENOUS
  Administered 2012-12-06: 50 ug via INTRAVENOUS
  Administered 2012-12-06: 25 ug via INTRAVENOUS
  Administered 2012-12-06 (×2): 50 ug via INTRAVENOUS

## 2012-12-06 MED ORDER — MORPHINE SULFATE 2 MG/ML IJ SOLN: 1.0000 mg | INTRAMUSCULAR | Status: DC | PRN

## 2012-12-06 MED ORDER — MIDAZOLAM HCL 5 MG/5ML IJ SOLN
INTRAMUSCULAR | Status: DC | PRN
  Administered 2012-12-06: 2 mg via INTRAVENOUS

## 2012-12-06 MED ORDER — ATORVASTATIN CALCIUM 10 MG PO TABS
10.0000 mg | ORAL_TABLET | Freq: Every day | ORAL | Status: DC
  Administered 2012-12-06: 10 mg via ORAL
  Filled 2012-12-06 (×2): qty 1

## 2012-12-06 MED ORDER — LACTATED RINGERS IV SOLN
INTRAVENOUS | Status: DC | PRN
  Administered 2012-12-06 (×2): via INTRAVENOUS

## 2012-12-06 MED ORDER — METOCLOPRAMIDE HCL 5 MG PO TABS
5.0000 mg | ORAL_TABLET | Freq: Three times a day (TID) | ORAL | Status: DC | PRN
  Filled 2012-12-06: qty 2

## 2012-12-06 MED ORDER — ALBUMIN HUMAN 5 % IV SOLN
INTRAVENOUS | Status: DC | PRN
  Administered 2012-12-06: 09:00:00 via INTRAVENOUS

## 2012-12-06 MED ORDER — HEMOSTATIC AGENTS (NO CHARGE) OPTIME
TOPICAL | Status: DC | PRN
  Administered 2012-12-06: 1 via TOPICAL

## 2012-12-06 MED ORDER — SODIUM CHLORIDE 0.9 % IR SOLN
Status: DC | PRN
  Administered 2012-12-06: 1000 mL
  Administered 2012-12-06: 3000 mL

## 2012-12-06 MED ORDER — TRIAMCINOLONE ACETONIDE 40 MG/ML IJ SUSP
INTRAMUSCULAR | Status: DC | PRN
  Administered 2012-12-06: 40 mg via INTRA_ARTICULAR

## 2012-12-06 MED ORDER — ZOLPIDEM TARTRATE 5 MG PO TABS: 5.0000 mg | ORAL_TABLET | Freq: Every evening | ORAL | Status: DC | PRN

## 2012-12-06 SURGICAL SUPPLY — 82 items
ASSEMBLY NECK TAPER FIXED 135 (Orthopedic Implant) ×3 IMPLANT
BANDAGE ADHESIVE 1X3 (GAUZE/BANDAGES/DRESSINGS) ×3 IMPLANT
BIT DRILL 1/16X5 DISP (BIT) ×3 IMPLANT
BIT DRILL 5/64X5 DISP (BIT) IMPLANT
BLADE SAW SAG 73X25 THK (BLADE) ×1
BLADE SAW SGTL 73X25 THK (BLADE) ×2 IMPLANT
BLADE SURG 15 STRL LF DISP TIS (BLADE) ×2 IMPLANT
BLADE SURG 15 STRL SS (BLADE) ×1
BOWL SMART MIX CTS (DISPOSABLE) IMPLANT
CEMENT BONE DEPUY (Cement) ×3 IMPLANT
CHLORAPREP W/TINT 26ML (MISCELLANEOUS) ×3 IMPLANT
CLOTH BEACON ORANGE TIMEOUT ST (SAFETY) IMPLANT
COVER SURGICAL LIGHT HANDLE (MISCELLANEOUS) ×3 IMPLANT
DRAPE INCISE IOBAN 66X45 STRL (DRAPES) ×6 IMPLANT
DRAPE SURG 17X23 STRL (DRAPES) ×3 IMPLANT
DRAPE U-SHAPE 47X51 STRL (DRAPES) ×3 IMPLANT
DRSG ADAPTIC 3X8 NADH LF (GAUZE/BANDAGES/DRESSINGS) IMPLANT
DRSG MEPILEX BORDER 4X8 (GAUZE/BANDAGES/DRESSINGS) ×3 IMPLANT
DRSG PAD ABDOMINAL 8X10 ST (GAUZE/BANDAGES/DRESSINGS) IMPLANT
ELECT BLADE 4.0 EZ CLEAN MEGAD (MISCELLANEOUS) ×3
ELECT REM PT RETURN 9FT ADLT (ELECTROSURGICAL) ×3
ELECTRODE BLDE 4.0 EZ CLN MEGD (MISCELLANEOUS) ×2 IMPLANT
ELECTRODE REM PT RTRN 9FT ADLT (ELECTROSURGICAL) ×2 IMPLANT
EVACUATOR 1/8 PVC DRAIN (DRAIN) ×3 IMPLANT
GLENOID ANCHOR PEG CROSSLK 48 (Orthopedic Implant) ×3 IMPLANT
GLOVE BIO SURGEON STRL SZ7 (GLOVE) ×3 IMPLANT
GLOVE BIO SURGEON STRL SZ7.5 (GLOVE) ×3 IMPLANT
GLOVE BIOGEL PI IND STRL 7.0 (GLOVE) ×4 IMPLANT
GLOVE BIOGEL PI IND STRL 8 (GLOVE) ×2 IMPLANT
GLOVE BIOGEL PI INDICATOR 7.0 (GLOVE) ×2
GLOVE BIOGEL PI INDICATOR 8 (GLOVE) ×1
GLOVE BIOGEL PI ORTHO PRO 7.5 (GLOVE) ×1
GLOVE PI ORTHO PRO STRL 7.5 (GLOVE) ×2 IMPLANT
GLOVE SURG SS PI 7.5 STRL IVOR (GLOVE) ×3 IMPLANT
GOWN PREVENTION PLUS LG XLONG (DISPOSABLE) ×6 IMPLANT
GOWN SRG XL XLNG 56XLVL 4 (GOWN DISPOSABLE) ×2 IMPLANT
GOWN STRL NON-REIN XL XLG LVL4 (GOWN DISPOSABLE) ×1
GOWN STRL REIN XL XLG (GOWN DISPOSABLE) ×6 IMPLANT
HANDPIECE INTERPULSE COAX TIP (DISPOSABLE) ×1
HEAD HUMERAL ECC SZ 48X21 (Head) ×3 IMPLANT
HEMOSTAT SURGICEL 2X14 (HEMOSTASIS) ×3 IMPLANT
HOOD PEEL AWAY FACE SHEILD DIS (HOOD) ×6 IMPLANT
KIT BASIN OR (CUSTOM PROCEDURE TRAY) ×3 IMPLANT
KIT ROOM TURNOVER OR (KITS) ×3 IMPLANT
MANIFOLD NEPTUNE II (INSTRUMENTS) ×3 IMPLANT
NEEDLE 18GX1X1/2 (RX/OR ONLY) (NEEDLE) ×3 IMPLANT
NEEDLE 22X1 1/2 (OR ONLY) (NEEDLE) ×3 IMPLANT
NEEDLE HYPO 25GX1X1/2 BEV (NEEDLE) IMPLANT
NEEDLE MAYO TROCAR (NEEDLE) ×3 IMPLANT
NS IRRIG 1000ML POUR BTL (IV SOLUTION) ×3 IMPLANT
PACK SHOULDER (CUSTOM PROCEDURE TRAY) ×3 IMPLANT
PAD ARMBOARD 7.5X6 YLW CONV (MISCELLANEOUS) ×6 IMPLANT
PIN METAGLENE 2.5 (PIN) ×3 IMPLANT
RETRIEVER SUT HEWSON (MISCELLANEOUS) ×3 IMPLANT
SET HNDPC FAN SPRY TIP SCT (DISPOSABLE) ×2 IMPLANT
SLING ARM IMMOBILIZER LRG (SOFTGOODS) ×3 IMPLANT
SLING ARM IMMOBILIZER MED (SOFTGOODS) IMPLANT
SMARTMIX MINI TOWER (MISCELLANEOUS) ×3
SPONGE GAUZE 4X4 12PLY (GAUZE/BANDAGES/DRESSINGS) ×3 IMPLANT
SPONGE LAP 18X18 X RAY DECT (DISPOSABLE) ×3 IMPLANT
SPONGE LAP 4X18 X RAY DECT (DISPOSABLE) IMPLANT
STEM GLOBAL AP 12MM (Stem) ×3 IMPLANT
STRIP CLOSURE SKIN 1/2X4 (GAUZE/BANDAGES/DRESSINGS) ×6 IMPLANT
SUCTION FRAZIER TIP 10 FR DISP (SUCTIONS) ×3 IMPLANT
SUPPORT WRAP ARM LG (MISCELLANEOUS) ×3 IMPLANT
SUT ETHIBOND 2 OS 4 DA (SUTURE) IMPLANT
SUT ETHIBOND NAB CT1 #1 30IN (SUTURE) ×18 IMPLANT
SUT FIBERWIRE #2 38 T-5 BLUE (SUTURE) ×9
SUT MNCRL AB 4-0 PS2 18 (SUTURE) ×3 IMPLANT
SUT SILK 2 0 TIES 17X18 (SUTURE)
SUT SILK 2-0 18XBRD TIE BLK (SUTURE) IMPLANT
SUT VIC AB 0 CTB1 27 (SUTURE) IMPLANT
SUT VIC AB 2-0 CT1 27 (SUTURE) ×2
SUT VIC AB 2-0 CT1 TAPERPNT 27 (SUTURE) ×4 IMPLANT
SUTURE FIBERWR #2 38 T-5 BLUE (SUTURE) ×6 IMPLANT
SYR CONTROL 10ML LL (SYRINGE) ×3 IMPLANT
TAPE FIBER 2MM 7IN #2 BLUE (SUTURE) ×9 IMPLANT
TOWEL OR 17X24 6PK STRL BLUE (TOWEL DISPOSABLE) ×3 IMPLANT
TOWEL OR 17X26 10 PK STRL BLUE (TOWEL DISPOSABLE) ×3 IMPLANT
TOWER SMARTMIX MINI (MISCELLANEOUS) ×2 IMPLANT
TRAY FOLEY CATH 16FRSI W/METER (SET/KITS/TRAYS/PACK) IMPLANT
WATER STERILE IRR 1000ML POUR (IV SOLUTION) ×3 IMPLANT

## 2012-12-06 NOTE — Preoperative (Signed)
Beta Blockers   Reason not to administer Beta Blockers:Not Applicable 

## 2012-12-06 NOTE — Progress Notes (Signed)
Orthopedic Tech Progress Note Patient Details:  Bruce Morgan 08-29-1940 [id number] Shoulder abduction pillow delivered to PACU Ortho Devices Type of Ortho Device: Shoulder abduction pillow Ortho Device/Splint Interventions: Ordered   [place] 12/06/2012, 10:47 AM

## 2012-12-06 NOTE — H&P (Signed)
Bruce Morgan is an 72 y.o. male.   Chief Complaint: Bilateral shoulder pain and dysfunction, left greater than right HPI: 72 year old male with severe bilateral endstage glenohumeral osteoarthritis, left more symptomatic than right. He has failed conservative management with activity modification, medications and exercises. He wished to go forward with surgery to improve quality of life. 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  Past Medical History  Diagnosis Date  . Hypertension   . Nocturia     3 TO 4 TIMES EVERY NIGHT  . Arthritis   . Dizziness     Past Surgical History  Procedure Laterality Date  . Teeth extractions / permanent dental implants  2012  . Tonsillectomy      AS A CHILD  . Total hip arthroplasty  02/16/2012    Procedure: TOTAL HIP ARTHROPLASTY ANTERIOR APPROACH;  Surgeon: Kathryne Hitch, MD;  Location: [facility];  Service: Orthopedics;  Laterality: Right;  Right Total Hip Arthroplasty  . Incision and drainage  02/25/2012    Procedure: INCISION AND DRAINAGE;  Surgeon: Eldred Manges, MD;  Location: [facility];  Service: Orthopedics;  Laterality: Right;  . Joint replacement Right     History reviewed. No pertinent family history. Social History:  reports that he has never smoked. He has never used smokeless tobacco. He reports that he does not drink alcohol or use illicit drugs.  Allergies: No Known Allergies  Medications Prior to Admission  Medication Sig Dispense Refill  . Coenzyme Q10 (CO Q10 PO) Take 10 mLs by mouth daily.      . Cyanocobalamin (VITAMIN B-12 PO) Take 1 tablet by mouth daily.      Marland Kitchen diltiazem (CARDIZEM CD) 240 MG 24 hr capsule Take 240 mg by mouth daily.      Marland Kitchen doxazosin (CARDURA) 8 MG tablet Take 8 mg by mouth at bedtime.      . Glucosamine-MSM-Hyaluronic Acd (JOINT HEALTH PO) Take  2 capsules by mouth daily.      Marland Kitchen HYDROcodone-acetaminophen (NORCO/VICODIN) 5-325 MG per tablet Take 1 tablet by mouth every 6 (six) hours as needed for pain.      Marland Kitchen KRILL OIL PO Take 1 capsule by mouth daily.      . Melatonin 3 MG TABS Take 3 mg by mouth at bedtime as needed (for sleep).      . metoprolol succinate (TOPROL-XL) 100 MG 24 hr tablet Take 100 mg by mouth daily. Take with or immediately following a meal.      . rosuvastatin (CRESTOR) 5 MG tablet Take 5 mg by mouth at bedtime.      . saw palmetto 500 MG capsule Take 500 mg by mouth 2 (two) times daily.        No results found for this or any previous visit (from the past 48 hour(s)). No results found.  Review of Systems  All other systems reviewed and are negative.    Blood pressure 188/83, pulse 65, temperature 97.3 F (36.3 C), temperature source Oral, resp. rate 20, SpO2 96.00%. Physical Exam  Constitutional: He is oriented to person, place, and time. He appears well-developed and well-nourished.  HENT:  Head: Atraumatic.  Eyes: EOM are normal.  Cardiovascular: Intact distal pulses.   Respiratory: Effort normal.  Musculoskeletal:       Right shoulder: He exhibits  decreased range of motion, bony tenderness and pain.       Left shoulder: He exhibits decreased range of motion, tenderness and pain.  Neurological: He is alert and oriented to person, place, and time.  Skin: Skin is warm and dry.  Psychiatric: He has a normal mood and affect.     Assessment/Plan Bilateral glenohumeral osteoarthritis, severe bone on bone, left more symptomatic than right Plan for left total shoulder replacement. The patient also would like a corticosteroid injection to the right shoulder while he is asleep. I think it is a good option for him, as he will be using the right side more as he recovers from the left. Risks / benefits of surgery discussed Consent on chart  NPO for OR Preop antibiotics- Vancomycin   Jerald Villalona  WILLIAM 12/06/2012, 7:15 AM

## 2012-12-06 NOTE — Anesthesia Procedure Notes (Addendum)
Procedure Name: Intubation Date/Time: 12/06/2012 7:40 AM Performed by: Armandina Gemma Pre-anesthesia Checklist: Patient identified, Emergency Drugs available, Timeout performed, Suction available and Patient being monitored Patient Re-evaluated:Patient Re-evaluated prior to inductionOxygen Delivery Method: Circle system utilized Preoxygenation: Pre-oxygenation with 100% oxygen Intubation Type: IV induction Ventilation: Mask ventilation without difficulty Laryngoscope Size: Miller and 2 Tube type: Oral Tube size: 7.5 mm Number of attempts: 1 Airway Equipment and Method: Stylet Placement Confirmation: ETT inserted through vocal cords under direct vision,  positive ETCO2 and breath sounds checked- equal and bilateral Secured at: 23 cm Tube secured with: Tape Dental Injury: Teeth and Oropharynx as per pre-operative assessment  Comments: IV induction Tashi Band- intubation AM CRNA- atraumatic- teeth and mouth as preop    Anesthesia Regional Block:  Interscalene brachial plexus block  Pre-Anesthetic Checklist: ,, timeout performed, Correct Patient, Correct Site, Correct Laterality, Correct Procedure, Correct Position, site marked, Risks and benefits discussed,  Surgical consent,  Pre-op evaluation,  At surgeon's request and post-op pain management  Laterality: Left  Prep: chloraprep       Needles:  Injection technique: Single-shot  Needle Type: Echogenic Stimulator Needle     Needle Length: 5cm 5 cm Needle Gauge: 22 and 22 G    Additional Needles:  Procedures: ultrasound guided (picture in chart) and nerve stimulator Interscalene brachial plexus block  Nerve Stimulator or Paresthesia:  Response: bicep contraction, 0.45 mA,   Additional Responses:   Narrative:  Start time: 12/06/2012 7:04 AM End time: 12/06/2012 7:14 AM Injection made incrementally with aspirations every 5 mL.  Performed by: Personally  Anesthesiologist: J. Adonis Huguenin, MD  Additional  Notes: Functioning IV was confirmed and monitors applied.  A 50mm 22ga echogenic arrow stimulator was used. Sterile prep and drape,hand hygiene and sterile gloves were used.Ultrasound guidance: relevant anatomy identified, needle position confirmed, local anesthetic spread visualized around nerve(s)., vascular puncture avoided.  Image printed for medical record.  Negative aspiration and negative test dose prior to incremental administration of local anesthetic. The patient tolerated the procedure well.  Interscalene brachial plexus block

## 2012-12-06 NOTE — Transfer of Care (Signed)
Immediate Anesthesia Transfer of Care Note  Patient: Bruce Morgan  Procedure(s) Performed: Procedure(s) with comments: TOTAL SHOULDER ARTHROPLASTY (Left) SHOULDER INJECTION (Right) - 40 mg/ml  x 1 ml   kenalog mixed with  0.25% Marcaine x  6ml.  Patient Location: PACU  Anesthesia Type:General  Level of Consciousness: awake and alert   Airway & Oxygen Therapy: Patient Spontanous Breathing and Patient connected to nasal cannula oxygen  Post-op Assessment: Report given to PACU RN and Post -op Vital signs reviewed and stable  Post vital signs: Reviewed and stable  Complications: No apparent anesthesia complications

## 2012-12-06 NOTE — Anesthesia Preprocedure Evaluation (Addendum)
Anesthesia Evaluation  Patient identified by MRN, date of birth, ID band Patient awake    Reviewed: Allergy & Precautions, H&P , NPO status , Patient's Chart, lab work & pertinent test results, reviewed documented beta blocker date and time   History of Anesthesia Complications Negative for: history of anesthetic complications  Airway Mallampati: II TM Distance: >3 FB Neck ROM: Full    Dental  (+) Dental Advisory Given and Teeth Intact   Pulmonary neg pulmonary ROS,    Pulmonary exam normal       Cardiovascular hypertension, Pt. on medications and Pt. on home beta blockers     Neuro/Psych negative neurological ROS  negative psych ROS   GI/Hepatic negative GI ROS, Neg liver ROS,   Endo/Other  negative endocrine ROS  Renal/GU negative Renal ROS  negative genitourinary   Musculoskeletal  (+) Arthritis -, Osteoarthritis,    Abdominal   Peds  Hematology negative hematology ROS (+)   Anesthesia Other Findings   Reproductive/Obstetrics                         Anesthesia Physical Anesthesia Plan  ASA: III  Anesthesia Plan: General   Post-op Pain Management:    Induction: Intravenous  Airway Management Planned: Oral ETT  Additional Equipment:   Intra-op Plan:   Post-operative Plan: Extubation in OR  Informed Consent: I have reviewed the patients History and Physical, chart, labs and discussed the procedure including the risks, benefits and alternatives for the proposed anesthesia with the patient or authorized representative who has indicated his/her understanding and acceptance.   Dental advisory given  Plan Discussed with: CRNA, Anesthesiologist and Surgeon  Anesthesia Plan Comments:        Anesthesia Quick Evaluation

## 2012-12-06 NOTE — Op Note (Signed)
Procedure(s): TOTAL SHOULDER ARTHROPLASTY SHOULDER INJECTION Procedure Note  Bruce Morgan male 72 y.o. 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 male  With endstage bilateral shoulder arthritis, left more symptomatic than right. Pain and dysfunction interfered with quality of life and nonoperative treatment with activity modification, NSAIDS and injections failed. He wished to go forward with left shoulder total shoulder arthroplasty, and requested a glenohumeral injection the right shoulder surgery to try and help with his recovery. He did show some posterior subluxation and posterior glenoid wear on x-ray and CT scan.     Surgeon: Mable Paris   Assistants: Damita Lack PA-C [facility] was present and scrubbed throughout the procedure and was essential in positioning, retraction, exposure, and closure)  Anesthesia: General endotracheal anesthesia with preoperative interscalene block    Procedure Detail  TOTAL SHOULDER ARTHROPLASTY, SHOULDER INJECTION  Findings: DePuy size 12 proximally porous coated press-fit stem, 48 x 21 eccentric head, 48 anchor peg glenoid. There was a significant tendency towards posterior instability, even upsizing the head did not correct this. Therefore a posterior capsular plication was performed with 3 figure-of-eight sutures with Ethibond. This effectively tightened the posterior capsule and eliminated the tendency towards posterior subluxation. The right shoulder was injected through an anterior approach for the beginning of the procedure.  Estimated  Blood Loss:  200 mL         Drains: 1 medium hemovac  Blood Given: none          Specimens: none        Complications:  * No complications entered in OR log *         Disposition: PACU - hemodynamically stable.         Condition: stable    Procedure:   The patient was identified in the preoperative holding area where I personally marked the operative extremity after verifying with the patient and consent. He  was taken to the operating room where He was transferred to the   operative table.  The patient received an interscalene block in   the holding area by the attending anesthesiologist.  General anesthesia was induced   in the operating room without complication.  The patient did receive IV  Ancef prior to the commencement of the procedure.  The patient was   placed in the beach-chair position with the back raised about 30   degrees.  The nonoperative extremity and head and neck were carefully   positioned and padded protecting against neurovascular compromise.   The anterior shoulder on the right side was prepped with chlorhexidine solution and a 22-gauge needle was used just lateral to the palpable coracoid tip to enter the glenohumeral joint. A solution of 6 cc quarter percent Marcaine without epinephrine and 40 mg of Kenalog was introduced into the joint without resistance. A Band-Aid was applied.  The left upper extremity was then prepped and draped in the standard sterile   fashion.    The appropriate operative time-out was performed with   Anesthesia, the perioperative staff, as well as myself and we all agreed   that the left side was the correct operative site.  An approximately   10 cm incision was made from the tip of the coracoid to the center point of the   humerus at the level of the axilla.  Dissection was carried down sharply   through subcutaneous tissues and cephalic vein was identified and taken   laterally with the deltoid.  The pectoralis major was taken  medially.  The   upper 1 cm of the pectoralis major was released from its attachment on   the humerus.  The clavipectoral fascia was incised just lateral to the   conjoined tendon.  This incision was carried up to but not into the   coracoacromial ligament.  Digital palpation was used to prove   integrity of the axillary nerve which was protected throughout the   procedure.  Musculocutaneous nerve was not palpated in the operative   field.  Conjoined tendon was then retracted gently medially and the   deltoid laterally.  Anterior circumflex humeral vessels were clamped and   coagulated.  The soft tissues overlying the biceps was incised and this   incision was carried across the transverse humeral ligament to the base   of the coracoid.  The biceps was tenodesed to the soft tissue just above   pectoralis major and the remaining portion of the biceps superiorly was   excised.  An osteotomy was performed at the lesser tuberosity, but it turned out to be a relatively minimal osteotomy and the subscapularis was mostly taken off with a peel.  Capsule was then   released all the way down to the 6 o'clock position of the humeral head.   The humeral head was then delivered with simultaneous adduction,   extension and external rotation.  All humeral osteophytes were removed   and the anatomic neck of the humerus was marked and cut free hand at   approximately 25 degrees retroversion within about 3 mm of the cuff   reflection posteriorly.  The head size was estimated to be a 48 medium   offset.  At that point, the humeral head was retracted posteriorly with   a Fukuda retractor.   Remaining portion of the capsule was released at the base of the   coracoid.  The remaining biceps anchor and the entire anterior-inferior   labrum was excised.  The posterior labrum was also excised but the   posterior capsule was not released.  Posterior capsule was noted to be quite patulous. The guidepin was placed  bicortically with +3 elevated guide.  The reamer was used to ream to concentric bone with punctate bleeding.  This allowed preferential anterior reaming This gave an excellent concentric surface.  The center hole was then drilled for an anchor peg glenoid followed by the three peripheral holes and none of the peripheral holes   exited the glenoid wall.  The central hole did exit slightly anteriorly. I then pulse irrigated these holes and dried   them with Surgicel.  The three peripheral holes were then   pressurized cemented and the anchor peg glenoid was placed and impacted   with an excellent fit.  The glenoid was a 48 component.  The proximal humerus was then again exposed taking care not to displace the glenoid.    The humerus was then sequentially reamed going from 6 to 12 by 2 mm incriments. The 12 mm reamer was found to have appropriate cortical contact.  A   box osteotome was then used and a 12-mm broach.  The broach handle was  removed and the trial head was placed.   Calcar reamer was used.The eccentric  48 x 18 head fit best.  With the trial implantation of the component, there was   Significant posterior instability. Therefore the head was sized up to a 48 x 21 which allowed some improvement but there was still significant posterior instability, especially with forward flexion. Therefore the head trials were removed and a laminar spreader type retractor was used to expose the posterior capsule which was noted to be a patulous. 3 #2 Ethibond sutures were then placed sequentially superior to inferior and posterior capsule in a figure-of-eight fashion to tighten the posterior capsule. Once this was accomplished the head trials were again placed in a 48 x 21 eccentric head and appropriate stability with no further tendency towards posterior instability.   The trial was removed and the final implant was prepared on a back table.   Small holes were drilled on both sides of the lesser tuberosity  osteotomy, through which 3 fiber tapes were passed. The implant was then placed through the loop of all 3 #2 FiberWires and impacted with an excellent press-fit. This achieved excellent anatomic reconstruction of the proximal humerus, with a slightly oversized head..  The joint was then copiously irrigated with pulse lavage.  The subscapularis and   lesser tuberosity osteotomy were then repaired using the 3 fiber tapes previously passed.   One #1 Ethibond was placed at the rotator interval just above   the lesser tuberosity. After repair of the lesser tuberosity, copious irrigation was   used.   Skin was closed with 2-0 Vicryl sutures in the deep dermal layer and 4-0 Monocryl in a subcuticular  running fashion.  Sterile dressings were then applied including Steri- Strips, 4x4s, ABDs and tape.  The patient was placed in a sling and allowed to awaken from general anesthesia and taken to the recovery room in stable  condition.      POSTOPERATIVE PLAN:  Early passive range of motion will be allowed with the goal of 0 degrees external rotation and 90 degrees forward elevation in the plane of the scapula.  No internal rotation at this time.  No active motion of the arm until the lesser tuberosity and subscapularis heal.  The patient will likely be kept in the hospital for 1-2 days and then discharged home.

## 2012-12-06 NOTE — Anesthesia Postprocedure Evaluation (Signed)
Anesthesia Post Note  Patient: Bruce Morgan  Procedure(s) Performed: Procedure(s) (LRB): TOTAL SHOULDER ARTHROPLASTY (Left) SHOULDER INJECTION (Right)  Anesthesia type: general  Patient location: PACU  Post pain: Pain level controlled  Post assessment: Patient's Cardiovascular Status Stable  Last Vitals:  Filed Vitals:   12/06/12 1315  BP: 141/85  Pulse: 63  Temp:   Resp: 14    Post vital signs: Reviewed and stable  Level of consciousness: sedated  Complications: No apparent anesthesia complications

## 2012-12-07 DIAGNOSIS — M19012 Primary osteoarthritis, left shoulder: Secondary | ICD-10-CM

## 2012-12-07 LAB — BASIC METABOLIC PANEL
BUN: 23 mg/dL (ref 6–23)
CO2: 23 mEq/L (ref 19–32)
Calcium: 8.8 mg/dL (ref 8.4–10.5)
Creatinine, Ser: 1.16 mg/dL (ref 0.50–1.35)
GFR calc non Af Amer: 62 mL/min — ABNORMAL LOW (ref >90)
Glucose, Bld: 142 mg/dL — ABNORMAL HIGH (ref 70–99)
Potassium: 4.6 mEq/L (ref 3.5–5.1)
Sodium: 134 mEq/L — ABNORMAL LOW (ref 135–145)

## 2012-12-07 LAB — CBC
HCT: 31.7 % — ABNORMAL LOW (ref 39.0–52.0)
Hemoglobin: 10.8 g/dL — ABNORMAL LOW (ref 13.0–17.0)
MCH: 32.7 pg (ref 26.0–34.0)
MCHC: 34.1 g/dL (ref 30.0–36.0)
MCV: 96.1 fL (ref 78.0–100.0)
RBC: 3.3 MIL/uL — ABNORMAL LOW (ref 4.22–5.81)

## 2012-12-07 NOTE — Progress Notes (Signed)
PATIENT ID: Bruce Morgan   1 Day Post-Op Procedure(s) (LRB): TOTAL SHOULDER ARTHROPLASTY (Left) SHOULDER INJECTION (Right)  Subjective: Reports minimal pain this am. Tolerating left sling well but had to sleep on recliner. No other complaints or concerns.   Objective:  Filed Vitals:   12/07/12 0548  BP: 125/70  Pulse: 55  Temp: 97.9 F (36.6 C)  Resp: 18     L shoulder dressing c/d/i Sling intact Wiggles fingers, moderate swelling distally Distally NVI  Labs:   Recent Labs  12/07/12 0515  HGB 10.8*   Recent Labs  12/07/12 0515  WBC 11.4*  RBC 3.30*  HCT 31.7*  PLT 192   Recent Labs  12/07/12 0515  NA 134*  K 4.6  CL 101  CO2 23  BUN 23  CREATININE 1.16  GLUCOSE 142*  CALCIUM 8.8    Assessment and Plan: 1 day s/p left TSA with posterior capsular plication  Will work with PT today on hand, wrist, elbow ROM only Stay in shoulder immobilizer sling D/c home today Percocet for home pain control, script in chart  Fu in 2 weeks   VTE proph: ASA 325mg  BID, SCDs

## 2012-12-07 NOTE — Evaluation (Signed)
Occupational Therapy Evaluation and Discharge Patient Details Name: Bruce Morgan MRN: [id number] DOB: [birth year] Today's Date: 12/07/2012 Time: 5621-3086 OT Time Calculation (min): 50 min  OT Assessment / Plan / Recommendation History of present illness  LEFT TOTAL SHOULDER ARTHROPLASTY, left shoulder posterior capsular plication,   right SHOULDER GLENOHUMERAL INJECTION - General   Clinical Impression   This 72 yo male s/p above presents to acute OT with all education completed. Will D/C from acute OT.    OT Assessment  Progress rehab of shoulder as ordered by MD at follow-up appointment    Follow Up Recommendations  No OT follow up       Equipment Recommendations  None recommended by OT          Precautions / Restrictions Precautions Precautions: Shoulder Shoulder Interventions: Shoulder abduction pillow;At all times;Off for dressing/bathing/exercises Required Braces or Orthoses: Sling Restrictions Weight Bearing Restrictions: Yes LUE Weight Bearing: Non weight bearing        Acute Rehab OT Goals Patient Stated Goal: Go home today  Visit Information  Last OT Received On: 12/07/12 Assistance Needed: +1 History of Present Illness:  LEFT TOTAL SHOULDER ARTHROPLASTY, left shoulder posterior capsular plication,   right SHOULDER GLENOHUMERAL INJECTION - General       Prior Functioning     Home Living Family/patient expects to be discharged to:: Private residence Living Arrangements: Spouse/significant other Available Help at Discharge: Family Type of Home: House Home Access: Stairs to enter Additional Comments: has built in shower seat, sock aide and long shoehorn Prior Function Level of Independence: Needs assistance Communication Communication: No difficulties Dominant Hand: Right         Vision/Perception Vision - History Patient Visual Report: No change from baseline   Cognition  Cognition Arousal/Alertness: Awake/alert Behavior During Therapy:  WFL for tasks assessed/performed Overall Cognitive Status: Within Functional Limits for tasks assessed    Extremity/Trunk Assessment Upper Extremity Assessment Upper Extremity Assessment: LUE deficits/detail;RUE deficits/detail RUE Deficits / Details: Bad shoulder as well on this side LUE Deficits / Details: Elbow to hand WNL, shoulder no movement allowed at this time LUE Coordination: decreased gross motor     Mobility Bed Mobility Details for Bed Mobility Assistance: Pt up in recliner when I arrived Transfers Transfers: Sit to Stand;Stand to Sit Sit to Stand: 7: Independent;Without upper extremity assist;From chair/3-in-1 Stand to Sit: 7: Independent;Without upper extremity assist;To chair/3-in-1     Exercise Donning/doffing shirt without moving shoulder: Caregiver independent with task Method for sponge bathing under operated UE: Caregiver independent with task Donning/doffing sling/immobilizer: Caregiver independent with task Correct positioning of sling/immobilizer: Caregiver independent with task Pendulum exercises (written home exercise program):  (N/A) ROM for elbow, wrist and digits of operated UE: Independent Sling wearing schedule (on at all times/off for ADL's): Caregiver independent with task Proper positioning of operated UE when showering: Caregiver independent with task Dressing change:  (N/A) Positioning of UE while sleeping: Caregiver independent with task      End of Session OT - End of Session Activity Tolerance: Patient tolerated treatment well Patient left: in chair;with call bell/phone within reach;with family/visitor present       Evette Georges [phone] 12/07/2012, 1:17 PM

## 2012-12-10 ENCOUNTER — Encounter (HOSPITAL_COMMUNITY): Payer: Self-pay | Admitting: Orthopedic Surgery

## 2012-12-10 NOTE — Discharge Summary (Signed)
Discharged 10/17

## 2012-12-10 NOTE — Discharge Summary (Signed)
Patient ID: Bruce Morgan MRN: [id number] DOB/AGE: [birth year] 72 y.o.  Admit date: 12/06/2012 Discharge date: 12/10/2012  Admission Diagnoses:  Principal Problem:   Osteoarthritis of left shoulder   Discharge Diagnoses:  Same  Past Medical History  Diagnosis Date  . Hypertension   . Nocturia     3 TO 4 TIMES EVERY NIGHT  . Arthritis   . Dizziness     Surgeries: Procedure(s): TOTAL SHOULDER ARTHROPLASTY SHOULDER INJECTION on 12/06/2012   Consultants:    Discharged Condition: Improved  Hospital Course: Bruce Morgan is an 72 y.o. male who was admitted 12/06/2012 for operative treatment ofOsteoarthritis of left shoulder. Patient has severe unremitting pain that affects sleep, daily activities, and work/hobbies. After pre-op clearance the patient was taken to the operating room on 12/06/2012 and underwent  Procedure(s): TOTAL SHOULDER ARTHROPLASTY SHOULDER INJECTION.    Patient was given perioperative antibiotics:  Anti-infectives   Start     Dose/Rate Route Frequency Ordered Stop   12/06/12 1600  ceFAZolin (ANCEF) IVPB 2 g/50 mL premix     2 g 100 mL/hr over 30 Minutes Intravenous Every 6 hours 12/06/12 1045 12/07/12 0551   12/06/12 0600  ceFAZolin (ANCEF) IVPB 2 g/50 mL premix     2 g 100 mL/hr over 30 Minutes Intravenous On call to O.R. 12/05/12 1345 12/06/12 0745       Patient was given sequential compression devices, early ambulation, and ASA 325mg  BID to prevent DVT.  Patient benefited maximally from hospital stay and there were no complications.    Recent vital signs: No data found.    Recent laboratory studies: No results found for this basename: WBC, HGB, HCT, PLT, NA, K, CL, CO2, BUN, CREATININE, GLUCOSE, PT, INR, CALCIUM, 2,  in the last 72 hours   Discharge Medications:     Medication List    STOP taking these medications       HYDROcodone-acetaminophen 5-325 MG per tablet  Commonly known as:  NORCO/VICODIN      TAKE these medications        CO Q10 PO  Take 10 mLs by mouth daily.     diltiazem 240 MG 24 hr capsule  Commonly known as:  CARDIZEM CD  Take 240 mg by mouth daily.     doxazosin 8 MG tablet  Commonly known as:  CARDURA  Take 8 mg by mouth at bedtime.     JOINT HEALTH PO  Take 2 capsules by mouth daily.     KRILL OIL PO  Take 1 capsule by mouth daily.     Melatonin 3 MG Tabs  Take 3 mg by mouth at bedtime as needed (for sleep).     metoprolol succinate 100 MG 24 hr tablet  Commonly known as:  TOPROL-XL  Take 100 mg by mouth daily. Take with or immediately following a meal.     oxyCODONE-acetaminophen 5-325 MG per tablet  Commonly known as:  ROXICET  Take 1-2 tablets by mouth every 4 (four) hours as needed for pain.     rosuvastatin 5 MG tablet  Commonly known as:  CRESTOR  Take 5 mg by mouth at bedtime.     saw palmetto 500 MG capsule  Take 500 mg by mouth 2 (two) times daily.     VITAMIN B-12 PO  Take 1 tablet by mouth daily.        Diagnostic Studies: Dg Shoulder Left Port  12/06/2012   CLINICAL DATA:  Severe osteoarthritis of the left shoulder. Status post total  left shoulder arthroplasty.  EXAM: PORTABLE LEFT SHOULDER - 2+ VIEW  COMPARISON:  CT scan dated 11/08/2012  FINDINGS: The shoulder prosthesis appears in good position in the AP projection. No fractures.  IMPRESSION: Satisfactory appearance of the left shoulder after total shoulder arthroplasty.   Electronically Signed   By: Geanie Cooley M.D.   On: 12/06/2012 11:21    Disposition: 01-Home or Self Care      Discharge Orders   Future Orders Complete By Expires   Call MD / Call 911  As directed    Comments:     If you experience chest pain or shortness of breath, CALL 911 and be transported to the hospital emergency room.  If you develope a fever above 101 F, pus (white drainage) or increased drainage or redness at the wound, or calf pain, call your surgeon's office.   Constipation Prevention  As directed    Comments:     Drink  plenty of fluids.  Prune juice may be helpful.  You may use a stool softener, such as Colace (over the counter) 100 mg twice a day.  Use MiraLax (over the counter) for constipation as needed.   Diet - low sodium heart healthy  As directed    Increase activity slowly as tolerated  As directed       Follow-up Information   Follow up with Mable Paris, MD. Schedule an appointment as soon as possible for a visit in 2 weeks.   Specialty:  Orthopedic Surgery   Contact information:   [address] [place] [address] [phone]        Signed: Jiles Harold 12/10/2012, 8:26 AM

## 2014-10-21 IMAGING — CR DG PORTABLE PELVIS
1 series · 1 of 1 positions shown · non-contrast
Comparison: None.

CLINICAL DATA: Postop right hip surgery

PORTABLE PELVIS

[AP]
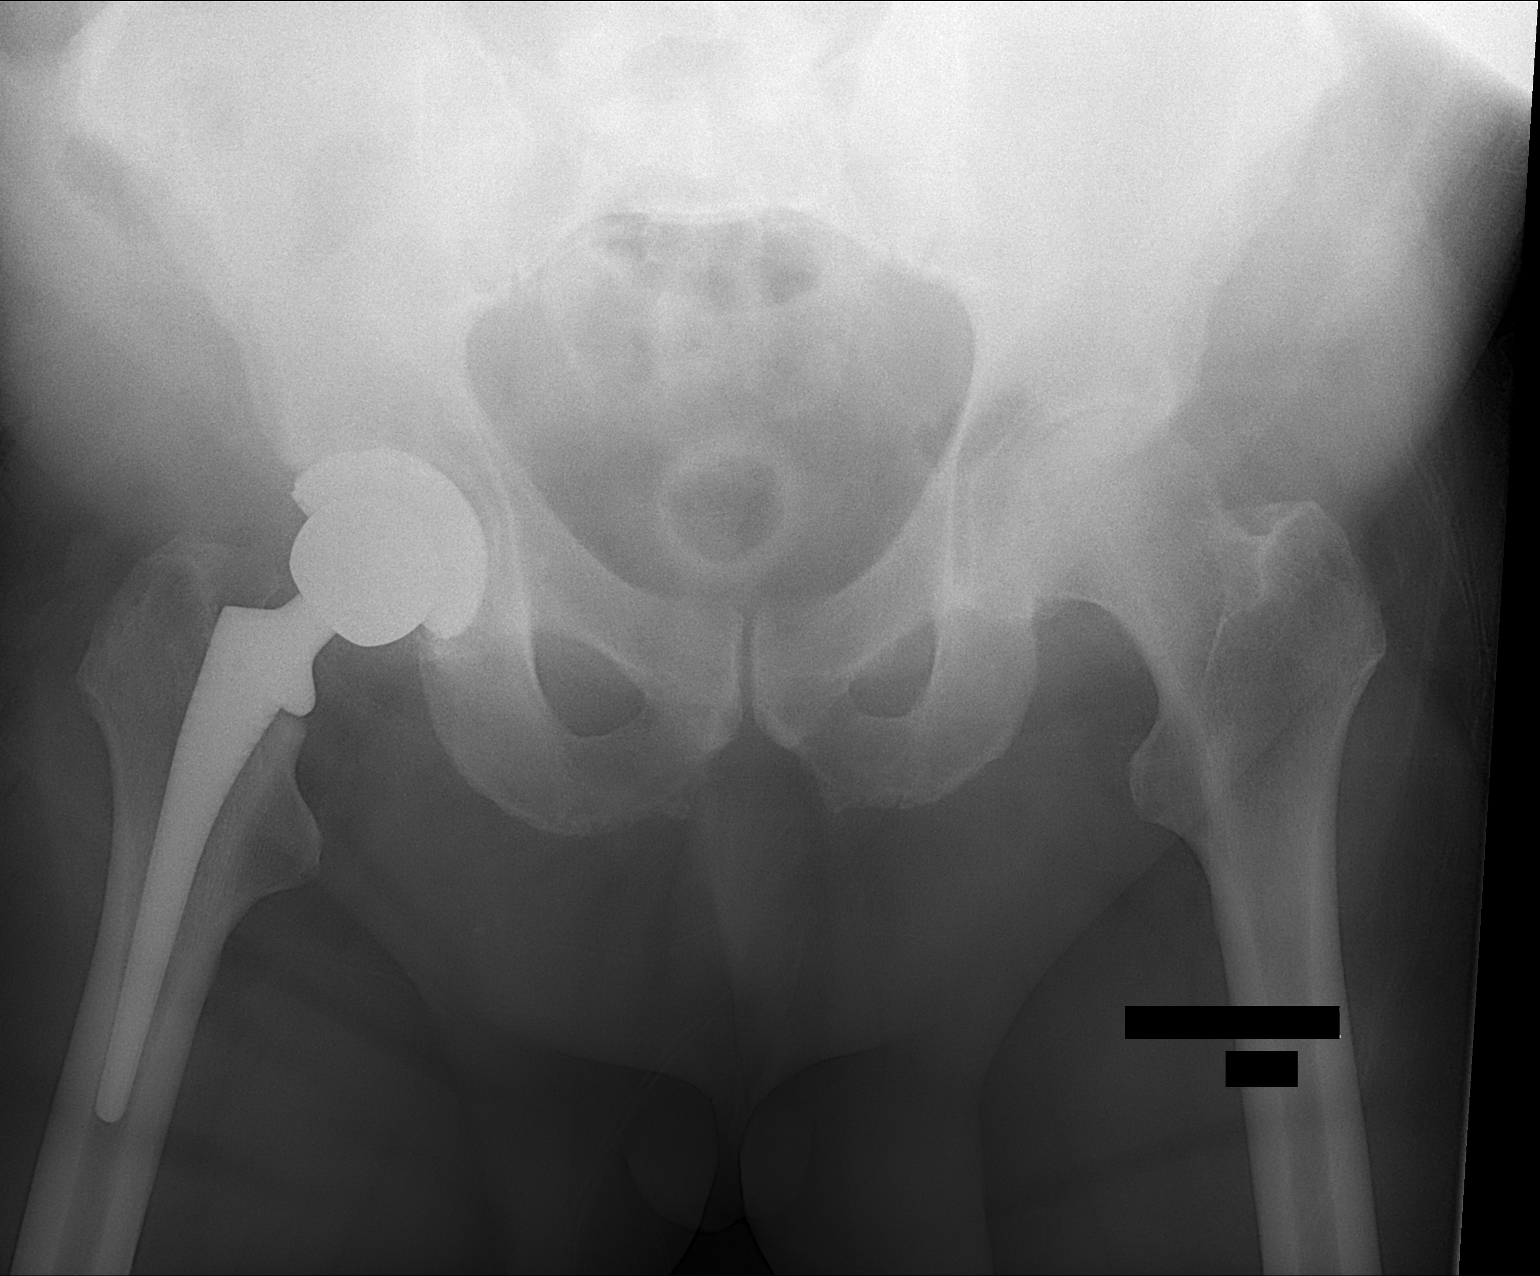

[1 of 1 positions shown; findings below may reference images not displayed]

FINDINGS: Patient is status post right total hip arthroplasty.
Satisfactory position and alignment.
IMPRESSION: As above.

## 2014-10-30 IMAGING — CR DG CHEST 1V
1 series · 1 of 1 positions shown · non-contrast
Comparison: 02/07/2012

CLINICAL DATA: preop right hip infection

CHEST - 1 VIEW

[AP]
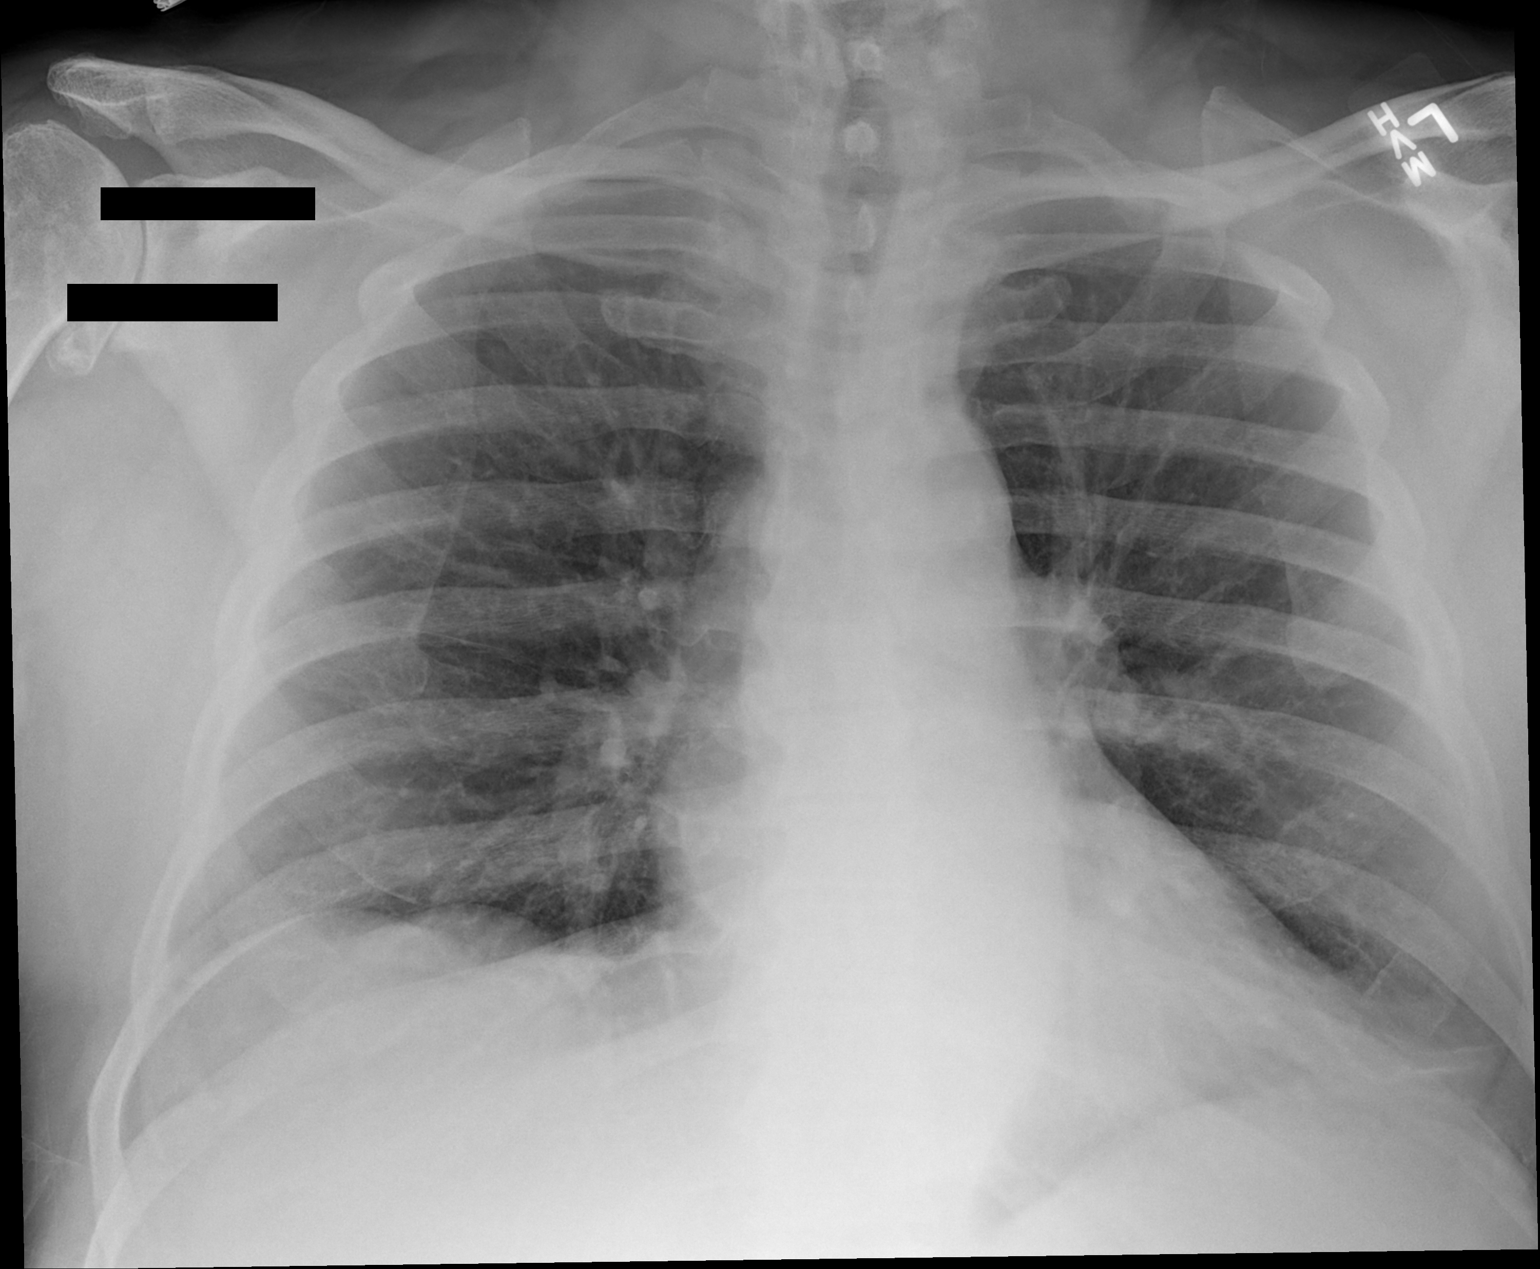

[1 of 1 positions shown; findings below may reference images not displayed]

FINDINGS: The lungs are clear without evidence of infiltrate or
effusion.  Negative for heart failure.  Mild eventration of the
right hemidiaphragm is unchanged.
IMPRESSION: No acute abnormality.

## 2015-08-11 IMAGING — CR DG SHOULDER 1V*L*
1 series · 1 of 1 positions shown · non-contrast
Comparison: CT scan dated 11/08/2012

CLINICAL DATA: Severe osteoarthritis of the left shoulder. Status
post total left shoulder arthroplasty.

EXAM:
PORTABLE LEFT SHOULDER - 2+ VIEW

[AP]
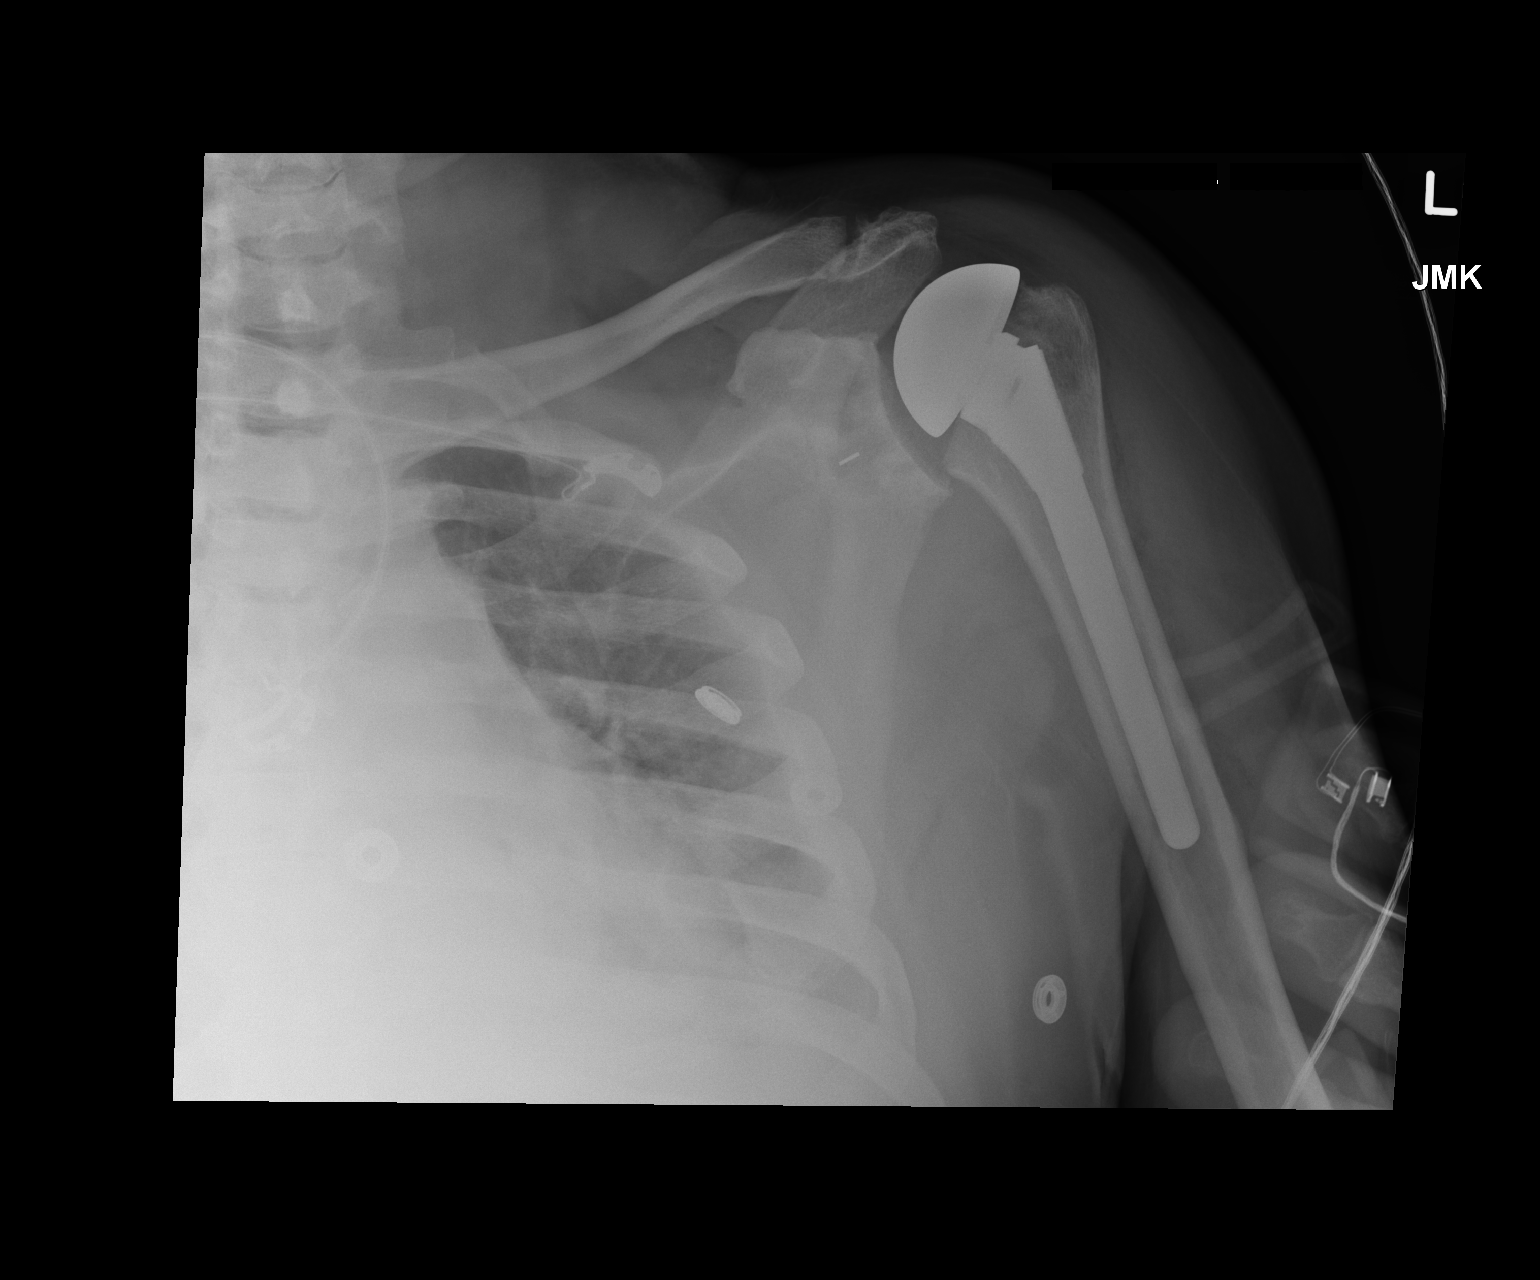

[1 of 1 positions shown; findings below may reference images not displayed]

FINDINGS: The shoulder prosthesis appears in good position in the AP
projection. No fractures.
IMPRESSION: Satisfactory appearance of the left shoulder after total shoulder
arthroplasty.

## 2019-07-01 ENCOUNTER — Other Ambulatory Visit: Payer: Self-pay | Admitting: Urology

## 2019-07-15 ENCOUNTER — Other Ambulatory Visit (HOSPITAL_COMMUNITY)
Admission: RE | Admit: 2019-07-15 | Discharge: 2019-07-15 | Disposition: A | Discharge status: Home / Self Care | Payer: Medicare HMO | Source: Ambulatory Visit | Attending: Urology | Admitting: Urology

## 2019-07-15 ENCOUNTER — Other Ambulatory Visit: Payer: Self-pay

## 2019-07-15 ENCOUNTER — Encounter (HOSPITAL_BASED_OUTPATIENT_CLINIC_OR_DEPARTMENT_OTHER): Payer: Self-pay | Admitting: Urology

## 2019-07-15 DIAGNOSIS — Z01812 Encounter for preprocedural laboratory examination: Secondary | ICD-10-CM | POA: Insufficient documentation

## 2019-07-15 DIAGNOSIS — Z20822 Contact with and (suspected) exposure to covid-19: Secondary | ICD-10-CM | POA: Diagnosis not present

## 2019-07-15 LAB — SARS CORONAVIRUS 2 (TAT 6-24 HRS): SARS Coronavirus 2: NEGATIVE

## 2019-07-15 NOTE — Progress Notes (Signed)
Spoke w/ via phone for pre-op interview---patient Lab needs dos----    I stat 8, ekg         COVID test ------07-15-2019@950  am Arrive at -------1215 pm 07-18-2019 No food after midnight, clear liquids from midnight until 815 am then npo Medications to take morning of surgery -----diltiazem, metorpolol succinate Diabetic medication -----n/a Patient Special Instructions -----none Pre-Op special Istructions -----none Patient verbalized understanding of instructions that were given at this phone interview. Patient denies shortness of breath, chest pain, fever, cough a this phone interview.

## 2019-07-18 ENCOUNTER — Encounter (HOSPITAL_BASED_OUTPATIENT_CLINIC_OR_DEPARTMENT_OTHER): Payer: Self-pay | Admitting: Urology

## 2019-07-18 ENCOUNTER — Ambulatory Visit (HOSPITAL_BASED_OUTPATIENT_CLINIC_OR_DEPARTMENT_OTHER): Payer: Medicare HMO | Admitting: Anesthesiology

## 2019-07-18 ENCOUNTER — Encounter (HOSPITAL_BASED_OUTPATIENT_CLINIC_OR_DEPARTMENT_OTHER)
Admission: RE | Disposition: A | Discharge status: Home / Self Care | Payer: Self-pay | Source: Home / Self Care | Attending: Urology

## 2019-07-18 ENCOUNTER — Observation Stay (HOSPITAL_BASED_OUTPATIENT_CLINIC_OR_DEPARTMENT_OTHER)
Admission: RE | Admit: 2019-07-18 | Discharge: 2019-07-19 | Disposition: A | Discharge status: Home / Self Care | Payer: Medicare HMO | Attending: Urology | Admitting: Urology

## 2019-07-18 DIAGNOSIS — M1990 Unspecified osteoarthritis, unspecified site: Secondary | ICD-10-CM | POA: Diagnosis not present

## 2019-07-18 DIAGNOSIS — K4020 Bilateral inguinal hernia, without obstruction or gangrene, not specified as recurrent: Principal | ICD-10-CM | POA: Insufficient documentation

## 2019-07-18 DIAGNOSIS — Z8614 Personal history of Methicillin resistant Staphylococcus aureus infection: Secondary | ICD-10-CM | POA: Insufficient documentation

## 2019-07-18 DIAGNOSIS — I10 Essential (primary) hypertension: Secondary | ICD-10-CM | POA: Insufficient documentation

## 2019-07-18 DIAGNOSIS — Z96643 Presence of artificial hip joint, bilateral: Secondary | ICD-10-CM | POA: Insufficient documentation

## 2019-07-18 DIAGNOSIS — G629 Polyneuropathy, unspecified: Secondary | ICD-10-CM | POA: Diagnosis not present

## 2019-07-18 DIAGNOSIS — N433 Hydrocele, unspecified: Secondary | ICD-10-CM | POA: Insufficient documentation

## 2019-07-18 HISTORY — DX: Carrier or suspected carrier of methicillin resistant Staphylococcus aureus: Z22.322

## 2019-07-18 HISTORY — DX: Carpal tunnel syndrome, left upper limb: G56.02

## 2019-07-18 HISTORY — PX: HYDROCELE EXCISION: SHX482

## 2019-07-18 HISTORY — PX: INGUINAL HERNIA REPAIR: SHX194

## 2019-07-18 HISTORY — DX: Low back pain, unspecified: M54.50

## 2019-07-18 HISTORY — DX: Polyneuropathy, unspecified: G62.9

## 2019-07-18 LAB — POCT I-STAT, CHEM 8
BUN: 18 mg/dL (ref 8–23)
Calcium, Ion: 1.28 mmol/L (ref 1.15–1.40)
Chloride: 106 mmol/L (ref 98–111)
Creatinine, Ser: 1.1 mg/dL (ref 0.61–1.24)
Glucose, Bld: 115 mg/dL — ABNORMAL HIGH (ref 70–99)
HCT: 42 % (ref 39.0–52.0)
Hemoglobin: 14.3 g/dL (ref 13.0–17.0)
Potassium: 4 mmol/L (ref 3.5–5.1)
Sodium: 141 mmol/L (ref 135–145)
TCO2: 23 mmol/L (ref 22–32)

## 2019-07-18 SURGERY — HYDROCELECTOMY
Anesthesia: General | Site: Scrotum | Laterality: Bilateral

## 2019-07-18 MED ORDER — DEXAMETHASONE SODIUM PHOSPHATE 10 MG/ML IJ SOLN
INTRAMUSCULAR | Status: AC
  Filled 2019-07-18: qty 1

## 2019-07-18 MED ORDER — EPHEDRINE 5 MG/ML INJ
INTRAVENOUS | Status: AC
  Filled 2019-07-18: qty 10

## 2019-07-18 MED ORDER — ROCURONIUM BROMIDE 10 MG/ML (PF) SYRINGE
PREFILLED_SYRINGE | INTRAVENOUS | Status: AC
  Filled 2019-07-18: qty 10

## 2019-07-18 MED ORDER — BUPIVACAINE LIPOSOME 1.3 % IJ SUSP
INTRAMUSCULAR | Status: DC | PRN
  Administered 2019-07-18: 20 mL

## 2019-07-18 MED ORDER — CEFAZOLIN SODIUM-DEXTROSE 2-4 GM/100ML-% IV SOLN
INTRAVENOUS | Status: AC
  Filled 2019-07-18: qty 100

## 2019-07-18 MED ORDER — PROPOFOL 10 MG/ML IV BOLUS
INTRAVENOUS | Status: AC
  Filled 2019-07-18: qty 20

## 2019-07-18 MED ORDER — ROCURONIUM BROMIDE 10 MG/ML (PF) SYRINGE
PREFILLED_SYRINGE | INTRAVENOUS | Status: DC | PRN
  Administered 2019-07-18: 50 mg via INTRAVENOUS
  Administered 2019-07-18: 10 mg via INTRAVENOUS
  Administered 2019-07-18: 20 mg via INTRAVENOUS
  Administered 2019-07-18 (×2): 10 mg via INTRAVENOUS

## 2019-07-18 MED ORDER — GLYCOPYRROLATE PF 0.2 MG/ML IJ SOSY
PREFILLED_SYRINGE | INTRAMUSCULAR | Status: AC
  Filled 2019-07-18: qty 1

## 2019-07-18 MED ORDER — ONDANSETRON HCL 4 MG/2ML IJ SOLN
INTRAMUSCULAR | Status: DC | PRN
  Administered 2019-07-18: 4 mg via INTRAVENOUS

## 2019-07-18 MED ORDER — FENTANYL CITRATE (PF) 100 MCG/2ML IJ SOLN: 25.0000 ug | INTRAMUSCULAR | Status: DC | PRN

## 2019-07-18 MED ORDER — DEXAMETHASONE SODIUM PHOSPHATE 10 MG/ML IJ SOLN
INTRAMUSCULAR | Status: DC | PRN
  Administered 2019-07-18: 4 mg via INTRAVENOUS

## 2019-07-18 MED ORDER — LACTATED RINGERS IV SOLN
INTRAVENOUS | Status: DC | PRN
Start: 2019-07-18 — End: 2019-07-18

## 2019-07-18 MED ORDER — FENTANYL CITRATE (PF) 100 MCG/2ML IJ SOLN
INTRAMUSCULAR | Status: AC
  Filled 2019-07-18: qty 2

## 2019-07-18 MED ORDER — ATROPINE SULFATE 0.4 MG/ML IJ SOLN
INTRAMUSCULAR | Status: DC | PRN
  Administered 2019-07-18: .4 mg via INTRAVENOUS

## 2019-07-18 MED ORDER — DILTIAZEM HCL ER COATED BEADS 240 MG PO CP24
240.0000 mg | ORAL_CAPSULE | Freq: Every day | ORAL | Status: DC
  Filled 2019-07-18: qty 1

## 2019-07-18 MED ORDER — MENTHOL 3 MG MT LOZG
1.0000 | LOZENGE | OROMUCOSAL | Status: DC | PRN
  Administered 2019-07-18: 3 mg via ORAL

## 2019-07-18 MED ORDER — CHLORHEXIDINE GLUCONATE 0.12 % MT SOLN: 15.0000 mL | Freq: Once | OROMUCOSAL | Status: DC

## 2019-07-18 MED ORDER — FENTANYL CITRATE (PF) 100 MCG/2ML IJ SOLN
INTRAMUSCULAR | Status: DC | PRN
  Administered 2019-07-18 (×3): 25 ug via INTRAVENOUS
  Administered 2019-07-18: 50 ug via INTRAVENOUS

## 2019-07-18 MED ORDER — SUCCINYLCHOLINE CHLORIDE 200 MG/10ML IV SOSY
PREFILLED_SYRINGE | INTRAVENOUS | Status: AC
  Filled 2019-07-18: qty 10

## 2019-07-18 MED ORDER — LIDOCAINE 2% (20 MG/ML) 5 ML SYRINGE
INTRAMUSCULAR | Status: DC | PRN
  Administered 2019-07-18: 100 mg via INTRAVENOUS

## 2019-07-18 MED ORDER — ONDANSETRON HCL 4 MG/2ML IJ SOLN: INTRAMUSCULAR | Status: DC | PRN

## 2019-07-18 MED ORDER — MORPHINE SULFATE (PF) 4 MG/ML IV SOLN: 2.0000 mg | INTRAVENOUS | Status: DC | PRN

## 2019-07-18 MED ORDER — SENNA 8.6 MG PO TABS
ORAL_TABLET | ORAL | Status: AC
  Filled 2019-07-18: qty 1

## 2019-07-18 MED ORDER — ORAL CARE MOUTH RINSE: 15.0000 mL | Freq: Once | OROMUCOSAL | Status: DC

## 2019-07-18 MED ORDER — SODIUM CHLORIDE 0.45 % IV SOLN: INTRAVENOUS | Status: DC

## 2019-07-18 MED ORDER — HYDROCODONE-ACETAMINOPHEN 5-325 MG PO TABS
1.0000 | ORAL_TABLET | ORAL | Status: DC | PRN
  Administered 2019-07-18 – 2019-07-19 (×4): 2 via ORAL

## 2019-07-18 MED ORDER — HYDROCODONE-ACETAMINOPHEN 5-325 MG PO TABS
ORAL_TABLET | ORAL | Status: AC
  Filled 2019-07-18: qty 2

## 2019-07-18 MED ORDER — CEFAZOLIN SODIUM-DEXTROSE 2-4 GM/100ML-% IV SOLN: 2.0000 g | INTRAVENOUS | Status: DC

## 2019-07-18 MED ORDER — EPHEDRINE SULFATE-NACL 50-0.9 MG/10ML-% IV SOSY
PREFILLED_SYRINGE | INTRAVENOUS | Status: DC | PRN
  Administered 2019-07-18: 15 mg via INTRAVENOUS
  Administered 2019-07-18: 20 mg via INTRAVENOUS
  Administered 2019-07-18: 15 mg via INTRAVENOUS

## 2019-07-18 MED ORDER — SENNA 8.6 MG PO TABS
1.0000 | ORAL_TABLET | Freq: Two times a day (BID) | ORAL | Status: DC
  Administered 2019-07-18: 8.6 mg via ORAL

## 2019-07-18 MED ORDER — SUGAMMADEX SODIUM 200 MG/2ML IV SOLN
INTRAVENOUS | Status: DC | PRN
Start: 2019-07-18 — End: 2019-07-18
  Administered 2019-07-18: 200 mg via INTRAVENOUS

## 2019-07-18 MED ORDER — CEFAZOLIN SODIUM-DEXTROSE 2-3 GM-%(50ML) IV SOLR
INTRAVENOUS | Status: DC | PRN
Start: 2019-07-18 — End: 2019-07-18
  Administered 2019-07-18: 2 g via INTRAVENOUS

## 2019-07-18 MED ORDER — ROSUVASTATIN CALCIUM 5 MG PO TABS
5.0000 mg | ORAL_TABLET | Freq: Every day | ORAL | Status: DC
  Administered 2019-07-18: 5 mg via ORAL
  Filled 2019-07-18: qty 1

## 2019-07-18 MED ORDER — PROPOFOL 10 MG/ML IV BOLUS
INTRAVENOUS | Status: DC | PRN
  Administered 2019-07-18: 20 mg via INTRAVENOUS
  Administered 2019-07-18: 200 mg via INTRAVENOUS
  Administered 2019-07-18: 50 mg via INTRAVENOUS

## 2019-07-18 MED ORDER — LIDOCAINE 2% (20 MG/ML) 5 ML SYRINGE
INTRAMUSCULAR | Status: AC
  Filled 2019-07-18: qty 5

## 2019-07-18 MED ORDER — MENTHOL 3 MG MT LOZG
LOZENGE | OROMUCOSAL | Status: AC
  Filled 2019-07-18: qty 9

## 2019-07-18 MED ORDER — SUCCINYLCHOLINE CHLORIDE 200 MG/10ML IV SOSY
PREFILLED_SYRINGE | INTRAVENOUS | Status: DC | PRN
Start: 2019-07-18 — End: 2019-07-18
  Administered 2019-07-18: 120 mg via INTRAVENOUS

## 2019-07-18 MED ORDER — METOPROLOL SUCCINATE ER 100 MG PO TB24
100.0000 mg | ORAL_TABLET | Freq: Every day | ORAL | Status: DC
  Filled 2019-07-18: qty 1

## 2019-07-18 MED ORDER — GLYCOPYRROLATE PF 0.2 MG/ML IJ SOSY
PREFILLED_SYRINGE | INTRAMUSCULAR | Status: DC | PRN
Start: 2019-07-18 — End: 2019-07-18
  Administered 2019-07-18: .2 mg via INTRAVENOUS

## 2019-07-18 MED ORDER — BUPIVACAINE HCL (PF) 0.25 % IJ SOLN
INTRAMUSCULAR | Status: DC | PRN
  Administered 2019-07-18: 30 mL

## 2019-07-18 MED ORDER — DOXAZOSIN MESYLATE 8 MG PO TABS
8.0000 mg | ORAL_TABLET | Freq: Every day | ORAL | Status: DC
  Administered 2019-07-18: 8 mg via ORAL
  Filled 2019-07-18: qty 1

## 2019-07-18 MED ORDER — LACTATED RINGERS IV SOLN: INTRAVENOUS | Status: DC

## 2019-07-18 MED ORDER — ACETAMINOPHEN 325 MG PO TABS: 650.0000 mg | ORAL_TABLET | ORAL | Status: DC | PRN

## 2019-07-18 MED ORDER — ONDANSETRON HCL 4 MG/2ML IJ SOLN
INTRAMUSCULAR | Status: AC
  Filled 2019-07-18: qty 2

## 2019-07-18 MED ORDER — ATROPINE SULFATE 0.4 MG/ML IJ SOLN
INTRAMUSCULAR | Status: AC
  Filled 2019-07-18: qty 1

## 2019-07-18 SURGICAL SUPPLY — 62 items
BLADE CLIPPER SENSICLIP SURGIC (BLADE) ×4 IMPLANT
BLADE SURG 15 STRL LF DISP TIS (BLADE) ×4 IMPLANT
BLADE SURG 15 STRL SS (BLADE) ×4
BNDG GAUZE ELAST 4 BULKY (GAUZE/BANDAGES/DRESSINGS) ×4 IMPLANT
CANISTER SUCT 3000ML PPV (MISCELLANEOUS) ×4 IMPLANT
CATH ROBINSON RED A/P 16FR (CATHETERS) ×4 IMPLANT
CLEANER CAUTERY TIP 5X5 PAD (MISCELLANEOUS) ×2 IMPLANT
COVER BACK TABLE 60X90IN (DRAPES) ×4 IMPLANT
COVER MAYO STAND STRL (DRAPES) ×4 IMPLANT
COVER WAND RF STERILE (DRAPES) ×4 IMPLANT
DERMABOND ADVANCED (GAUZE/BANDAGES/DRESSINGS) ×4
DERMABOND ADVANCED .7 DNX12 (GAUZE/BANDAGES/DRESSINGS) ×4 IMPLANT
DISSECTOR ROUND CHERRY 3/8 STR (MISCELLANEOUS) ×4 IMPLANT
DRAIN PENROSE 0.25X18 (DRAIN) ×4 IMPLANT
DRAPE LAPAROSCOPIC ABDOMINAL (DRAPES) ×4 IMPLANT
DRAPE LAPAROTOMY 100X72 PEDS (DRAPES) ×4 IMPLANT
DRAPE UTILITY XL STRL (DRAPES) ×4 IMPLANT
ELECT REM PT RETURN 9FT ADLT (ELECTROSURGICAL) ×4
ELECTRODE REM PT RTRN 9FT ADLT (ELECTROSURGICAL) ×2 IMPLANT
GAUZE SPONGE 4X4 12PLY STRL (GAUZE/BANDAGES/DRESSINGS) ×4 IMPLANT
GLOVE BIO SURGEON STRL SZ7 (GLOVE) ×4 IMPLANT
GLOVE BIO SURGEON STRL SZ8 (GLOVE) ×12 IMPLANT
GLOVE BIOGEL PI IND STRL 6.5 (GLOVE) ×2 IMPLANT
GLOVE BIOGEL PI IND STRL 7.5 (GLOVE) ×8 IMPLANT
GLOVE BIOGEL PI INDICATOR 6.5 (GLOVE) ×2
GLOVE BIOGEL PI INDICATOR 7.5 (GLOVE) ×8
GLOVE ECLIPSE 6.5 STRL STRAW (GLOVE) ×4 IMPLANT
GLOVE ECLIPSE 7.5 STRL STRAW (GLOVE) ×16 IMPLANT
GLOVE SURG SIGNA 7.5 PF LTX (GLOVE) ×4 IMPLANT
GLOVE SURG SYN 7.5  E (GLOVE) ×2
GLOVE SURG SYN 7.5 E (GLOVE) ×2 IMPLANT
GOWN STRL REUS W/ TWL LRG LVL3 (GOWN DISPOSABLE) ×2 IMPLANT
GOWN STRL REUS W/ TWL XL LVL3 (GOWN DISPOSABLE) ×4 IMPLANT
GOWN STRL REUS W/TWL LRG LVL3 (GOWN DISPOSABLE) ×10 IMPLANT
GOWN STRL REUS W/TWL XL LVL3 (GOWN DISPOSABLE) ×4
KIT TURNOVER CYSTO (KITS) ×4 IMPLANT
MANIFOLD NEPTUNE II (INSTRUMENTS) IMPLANT
MESH ULTRAPRO 3X6 7.6X15CM (Mesh General) ×8 IMPLANT
NEEDLE HYPO 22GX1.5 SAFETY (NEEDLE) IMPLANT
NEEDLE HYPO 25X1 1.5 SAFETY (NEEDLE) ×4 IMPLANT
NS IRRIG 500ML POUR BTL (IV SOLUTION) ×8 IMPLANT
PAD CLEANER CAUTERY TIP 5X5 (MISCELLANEOUS) ×2
PENCIL SMOKE EVACUATOR (MISCELLANEOUS) ×4 IMPLANT
SET BASIN DAY SURGERY F.S. (CUSTOM PROCEDURE TRAY) ×4 IMPLANT
SPONGE LAP 18X18 RF (DISPOSABLE) ×8 IMPLANT
SUPPORT SCROTAL LG STRP (MISCELLANEOUS) ×3 IMPLANT
SUPPORTER ATHLETIC LG (MISCELLANEOUS) ×1
SUT CHROMIC 2 0 TIES 18 (SUTURE) ×4 IMPLANT
SUT CHROMIC 3 0 SH 27 (SUTURE) ×12 IMPLANT
SUT MNCRL AB 4-0 PS2 18 (SUTURE) ×8 IMPLANT
SUT MON AB 5-0 PS2 18 (SUTURE) ×8 IMPLANT
SUT NOVA 0 T19/GS 22DT (SUTURE) ×28 IMPLANT
SUT VIC AB 2-0 SH 27 (SUTURE) ×4
SUT VIC AB 2-0 SH 27XBRD (SUTURE) ×4 IMPLANT
SUT VIC AB 3-0 SH 18 (SUTURE) ×16 IMPLANT
SYR BULB IRRIG 60ML STRL (SYRINGE) ×4 IMPLANT
TOWEL OR 17X26 10 PK STRL BLUE (TOWEL DISPOSABLE) ×4 IMPLANT
TRAY DSU PREP LF (CUSTOM PROCEDURE TRAY) ×4 IMPLANT
TUBE CONNECTING 12'X1/4 (SUCTIONS) ×1
TUBE CONNECTING 12X1/4 (SUCTIONS) ×3 IMPLANT
WATER STERILE IRR 500ML POUR (IV SOLUTION) ×4 IMPLANT
YANKAUER SUCT BULB TIP NO VENT (SUCTIONS) ×4 IMPLANT

## 2019-07-18 NOTE — Transfer of Care (Signed)
Last Vitals:  Vitals Value Taken Time  BP 137/75 07/18/19 1815  Temp    Pulse 65 07/18/19 1818  Resp 18 07/18/19 1818  SpO2 94 % 07/18/19 1818  Vitals shown include unvalidated device data.  Last Pain: There were no vitals filed for this visit.      Immediate Anesthesia Transfer of Care Note  Patient: Bruce Morgan  Procedure(s) Performed: Procedure(s) (LRB): SCROTAL EXPLORATION (Bilateral) OPEN BILATERAL INGUINAL HERNIA REPAIR WITH MESH (Bilateral)  Patient Location: PACU  Anesthesia Type: General  Level of Consciousness: awake, alert  and oriented  Airway & Oxygen Therapy: Patient Spontanous Breathing and Patient connected to face mask oxygen, oral airway out.  Post-op Assessment: Report given to PACU RN and Post -op Vital signs reviewed and stable  Post vital signs: Reviewed and stable  Complications: No apparent anesthesia complications

## 2019-07-18 NOTE — H&P (Signed)
H&P  Chief Complaint: Scrotal swelling  History of Present Illness: 79 yo male presents for bilateral hydrocelectomy  Past Medical History:  Diagnosis Date  . Arthritis   . Carpal tunnel syndrome of left wrist    wears brace to sleep  . Hypertension   . Lower back pain   . MRSA (methicillin resistant staph aureus) culture positive 2014 jan  . Neuropathy    hands feet and  legs tingle all the time  . Nocturia    3 TO 4 TIMES EVERY NIGHT    Past Surgical History:  Procedure Laterality Date  . INCISION AND DRAINAGE  02/25/2012   Procedure: INCISION AND DRAINAGE;  Surgeon: Eldred Manges, MD;  Location: [facility];  Service: Orthopedics;  Laterality: Right;  . JOINT REPLACEMENT Right   . SHOULDER INJECTION Right 12/06/2012   Procedure: SHOULDER INJECTION;  Surgeon: Mable Paris, MD;  Location: [facility] OR;  Service: Orthopedics;  Laterality: Right;  40 mg/ml  x 1 ml   kenalog mixed with  0.25% Marcaine x  69ml.  . TEETH EXTRACTIONS / PERMANENT DENTAL IMPLANTS  2012  . TONSILLECTOMY     AS A CHILD  . TOTAL HIP ARTHROPLASTY  02/16/2012   Procedure: TOTAL HIP ARTHROPLASTY ANTERIOR APPROACH;  Surgeon: Kathryne Hitch, MD;  Location: [facility];  Service: Orthopedics;  Laterality: Right;  Right Total Hip Arthroplasty  . TOTAL SHOULDER ARTHROPLASTY Left 12/06/2012   Procedure: TOTAL SHOULDER ARTHROPLASTY;  Surgeon: Mable Paris, MD;  Location: [facility] OR;  Service: Orthopedics;  Laterality: Left;    Home Medications:   Allergies: No Known Allergies  History reviewed. No pertinent family history.  Social History:  reports that he has never smoked. He has never used smokeless tobacco. He reports that he does not drink alcohol or use drugs.  ROS: A complete review of systems was performed.  All systems are negative except for pertinent findings as noted.  Physical Exam:  Vital signs in last 24 hours: Ht 5\' 11"  (1.803 m)   Wt 113.4 kg   BMI 34.87 kg/m  Constitutional:   Alert and oriented, No acute distress Cardiovascular: Regular rate  Respiratory: Normal respiratory effort GI: Abdomen is soft, nontender, nondistended, no abdominal masses. No CVAT.  Genitourinary: Normal male phallus, bilateral hydroceles Lymphatic: No lymphadenopathy Neurologic: Grossly intact, no focal deficits Psychiatric: Normal mood and affect  Laboratory Data:  Recent Labs    07/18/19 1234  HGB 14.3  HCT 42.0    Recent Labs    07/18/19 1234  NA 141  K 4.0  CL 106  GLUCOSE 115*  BUN 18  CREATININE 1.10     Results for orders placed or performed during the hospital encounter of 07/18/19 (from the past 24 hour(s))  I-STAT, chem 8     Status: Abnormal   Collection Time: 07/18/19 12:34 PM  Result Value Ref Range   Sodium 141 135 - 145 mmol/L   Potassium 4.0 3.5 - 5.1 mmol/L   Chloride 106 98 - 111 mmol/L   BUN 18 8 - 23 mg/dL   Creatinine, Ser 07/20/19 0.61 - 1.24 mg/dL   Glucose, Bld 7.82 (H) 70 - 99 mg/dL   Calcium, Ion 956 2.13 - 1.40 mmol/L   TCO2 23 22 - 32 mmol/L   Hemoglobin 14.3 13.0 - 17.0 g/dL   HCT 0.86 57.8 - 46.9 %   Recent Results (from the past 240 hour(s))  SARS CORONAVIRUS 2 (TAT 6-24 HRS) Nasopharyngeal Nasopharyngeal Swab  Status: None   Collection Time: 07/15/19  9:44 AM   Specimen: Nasopharyngeal Swab  Result Value Ref Range Status   SARS Coronavirus 2 NEGATIVE NEGATIVE Final    Comment: (NOTE) SARS-CoV-2 target nucleic acids are NOT DETECTED. The SARS-CoV-2 RNA is generally detectable in upper and lower respiratory specimens during the acute phase of infection. Negative results do not preclude SARS-CoV-2 infection, do not rule out co-infections with other pathogens, and should not be used as the sole basis for treatment or other patient management decisions. Negative results must be combined with clinical observations, patient history, and epidemiological information. The expected result is Negative. Fact Sheet for  Patients: SugarRoll.be Fact Sheet for Healthcare Providers: https://www.woods-mathews.com/ This test is not yet approved or cleared by the [place] FDA and  has been authorized for detection and/or diagnosis of SARS-CoV-2 by FDA under an Emergency Use Authorization (EUA). This EUA will remain  in effect (meaning this test can be used) for the duration of the COVID-19 declaration under Section 56 4(b)(1) of the Act, 21 U.S.C. section 360bbb-3(b)(1), unless the authorization is terminated or revoked sooner. Performed at [facility], [place] [address], [place], [place] [address]     Renal Function: Recent Labs    07/18/19 1234  CREATININE 1.10   Estimated Creatinine Clearance: 70.8 mL/min (by C-G formula based on SCr of 1.1 mg/dL).  Radiologic Imaging: No results found.  Impression/Assessment:  Bilateral hydroceles  Plan:  Bilateral hydrocelectomy

## 2019-07-18 NOTE — Op Note (Signed)
07/18/2019  6:07 PM  PATIENT:  Bruce Morgan, 79 y.o., male, MRN: [id number]  PREOP DIAGNOSIS:  BILATERAL inguinal hernias  POSTOP DIAGNOSIS:   Bilateral indirect inguinal hernias  PROCEDURE:   Procedure(s): SCROTAL EXPLORATION - D. Dahlstedt OPEN BILATERAL INGUINAL HERNIA REPAIR - D. Ezzard Standing  SURGEON:   Ovidio Kin, M.D.  Assistant - S. Dahlstedt, M.D.  ANESTHESIA:   general  Anesthesiologist: Mal Amabile, MD; Stephannie Peters Jaclynn Major, MD; Eilene Ghazi, MD CRNA: Norva Pavlov, CRNA  General  EBL:  75  ml  LOCAL MEDICATIONS USED:   20 cc of Exparel and 30 cc of 1/4% marcaine  SPECIMEN:   None  COUNTS CORRECT:  YES  INDICATIONS FOR PROCEDURE:  Bruce Morgan is a 79 y.o. (DOB: [birth year]) white male whose primary care physician is Janece Canterbury, MD.   The patient was undergoing a scrotal exploration for hydroceles by Dr. Alma Friendly.  But at exploration, he was found to have evidence of bilateral inguinal hernias.  I gave an intraoperative consultation.  Our options were to stop the surgery and consider inguinal hernia later or proceed with inguinal hernia surgery repair.    I went out and talked to his wife, I explained the options and the potential complications.  She agreed that he would want the hernias repaired now, if possible.  She gave consent to proceed with bilateral inguinal hernia repair.  Operative Note: The patient was in room number 1 at [facility].  He was under a general anesthesia.    A time out was held and the surgical checklist run.  His lower abdomen was shaved and then prepped with chloroprep.    I started on the right side.  A right inguinal incision was made through the subcutaneous fat to the external oblique fascia.  The incision on the right was a little high.  The external ring was opened and the cord structures encircled with a penrose drain.   He had a large ball of preperitoneal fat that had come out through the right internal  ring to the scrotum. The patient had an indirect right hernia.  I identified the ileo inguinal nerve and preserved this during the dissection.  There was no hernia sac to identify, just the large ball of fat.  This ball of fat was 10-12 cm in diameter when I got it out of the scrotum.  The right inguinal floor was repaired with a 3 x 6 inch piece of Ultrapro mesh.  The mesh was cut to fit the inguinal floor.  The mesh was sewn in place with interrupted 0 Novafil suture.  I sewed the mesh medially to the pubic tubercle, inferiorly to the shelving edge of the inguinal ligament, and superiorly to the transversalis fascia.   A key hole was made for the internal ring.  The mesh lay flat.  The inguinal floor was covered and the internal ring recreated.  The cord structures were returned to a normal location.  The external oblique was closed with a 3-0 vicryl.   I tried to recreate an external ring, but the fascia defect was large because of the large right inguinal herni - so he has a large external ring. The fascia and subcutaneous tissues on the right were infiltrated with 20 cc of the marcaine/exparel mixture.  The skin on the right was closed with 4-0 monocryl and painted with DermaBond.   I then turned my attention to the left inguinal hernia.  A left inguinal incision was made  through the subcutaneous fat to the external oblique fascia.  The external ring was opened and the cord structures encircled with a penrose drain.  The patient had a indirect left inguinal hernia.  Dr. Diona Fanti had already reduced the hernia on the left side somewhat - so at least the amount of fat that I dissected was not as large  But the left testicle has been freed up from the scrotum by Dr. Diona Fanti by his dissection, to the testicle came out into the groin.  We would work around it during the surgery.  Like the right side, there was no distinct sac, but a large ball of fat, about 8 cm in diameter.  The fat was reduced  through the internal ring.  I tried to tighten up the internal ring with a 2-0 vicryl suture.  The left inguinal floor was repaired with a 3 x 6 inch piece of Ultrapro mesh.  The mesh was cut to fit the inguinal floor.  The mesh was sewn in place with interrupted 0 Novafil suture.  I sewed the mesh medially to the pubic tubercle, inferiorly to the shelving edge of the inguinal ligament, and superiorly to the transversalis fascia.   A key hole was made for the internal ring.  The mesh lay flat.  The left inguinal floor was covered and the internal ring recreated.  The left testicle was returned to the left scrotum.  The left cord structures were returned to a normal location.  The external oblique was closed with a 3-0 vicryl.  The fascia and subcutaneous tissues were infiltrated with 30 cc of the marcaine/exparel mixture.  The left inguinal incision was closed with 4-0 monocryl and painted with DermaBond. The sponge and needle count were correct at the end of the case.  The patient was transported to the recovery room in good condition.  I have a surgeon as a first assist to retract, expose, and assist on this difficult operation.  Because of how late we finished the operation and the extent of the surgery, we will keep the patient for observation overnight.   Alphonsa Overall, MD, [facility] Pager: [phone] Office phone:  [phone]

## 2019-07-18 NOTE — Anesthesia Procedure Notes (Signed)
Procedure Name: LMA Insertion Date/Time: 07/18/2019 2:18 PM Performed by: Norva Pavlov, CRNA Pre-anesthesia Checklist: Patient identified, Emergency Drugs available, Suction available and Patient being monitored Patient Re-evaluated:Patient Re-evaluated prior to induction Oxygen Delivery Method: Circle system utilized Preoxygenation: Pre-oxygenation with 100% oxygen Induction Type: IV induction Ventilation: Mask ventilation without difficulty LMA: LMA inserted LMA Size: 5.0 Number of attempts: 1 Airway Equipment and Method: Bite block Placement Confirmation: positive ETCO2 Tube secured with: Tape Dental Injury: Teeth and Oropharynx as per pre-operative assessment

## 2019-07-18 NOTE — Anesthesia Procedure Notes (Addendum)
Procedure Name: Intubation Date/Time: 07/18/2019 3:13 PM Performed by: Mechele Claude, CRNA Pre-anesthesia Checklist: Patient identified, Emergency Drugs available, Suction available and Patient being monitored Patient Re-evaluated:Patient Re-evaluated prior to induction Oxygen Delivery Method: Circle system utilized Preoxygenation: Pre-oxygenation with 100% oxygen Induction Type: IV induction and Cricoid Pressure applied Ventilation: Mask ventilation without difficulty Laryngoscope Size: Mac and 4 Grade View: Grade III Tube type: Oral Tube size: 7.5 mm Number of attempts: 2 Airway Equipment and Method: Bougie stylet Placement Confirmation: ETT inserted through vocal cords under direct vision,  positive ETCO2 and breath sounds checked- equal and bilateral Secured at: 23 cm Tube secured with: Tape Dental Injury: Teeth and Oropharynx as per pre-operative assessment  Difficulty Due To: Difficulty was unanticipated and Difficult Airway- due to anterior larynx Future Recommendations: Recommend- induction with short-acting agent, and alternative techniques readily available

## 2019-07-18 NOTE — Anesthesia Preprocedure Evaluation (Signed)
Anesthesia Evaluation  Patient identified by MRN, date of birth, ID band Patient awake    Reviewed: Allergy & Precautions, NPO status , Patient's Chart, lab work & pertinent test results  Airway Mallampati: II  TM Distance: >3 FB Neck ROM: Full    Dental no notable dental hx.    Pulmonary neg pulmonary ROS,    Pulmonary exam normal breath sounds clear to auscultation       Cardiovascular hypertension, Pt. on medications and Pt. on home beta blockers Normal cardiovascular exam Rhythm:Regular Rate:Normal     Neuro/Psych negative neurological ROS  negative psych ROS   GI/Hepatic negative GI ROS, Neg liver ROS,   Endo/Other  negative endocrine ROS  Renal/GU negative Renal ROS  negative genitourinary   Musculoskeletal negative musculoskeletal ROS (+)   Abdominal   Peds negative pediatric ROS (+)  Hematology negative hematology ROS (+)   Anesthesia Other Findings   Reproductive/Obstetrics negative OB ROS                             Anesthesia Physical Anesthesia Plan  ASA: II  Anesthesia Plan: General   Post-op Pain Management:    Induction: Intravenous  PONV Risk Score and Plan: 2 and Ondansetron, Dexamethasone and Treatment may vary due to age or medical condition  Airway Management Planned: LMA  Additional Equipment:   Intra-op Plan:   Post-operative Plan: Extubation in OR  Informed Consent: I have reviewed the patients History and Physical, chart, labs and discussed the procedure including the risks, benefits and alternatives for the proposed anesthesia with the patient or authorized representative who has indicated his/her understanding and acceptance.     Dental advisory given  Plan Discussed with: CRNA and Surgeon  Anesthesia Plan Comments:         Anesthesia Quick Evaluation

## 2019-07-18 NOTE — OR Nursing (Signed)
Called the patient's wife Stanton Kidney and updated that surgery is now on the 2nd side and patient is doing well per Dr. Retta Diones and Dr. Ezzard Standing.

## 2019-07-18 NOTE — Op Note (Signed)
Preoperative diagnosis: Bilateral hydroceles  Postoperative diagnosis: Bilateral inguinal hernias  Principal procedure: Scrotal exploration, conversion to bilateral inguinal hernia repair by Dr. Ovidio Kin  Surgeon: Blanchie Zeleznik  Anesthesia: General  Complications: None, but errant diagnosis of bilateral hydroceles  Estimated blood loss: Less than 10 cc  Specimens: None  Indications: 79 year old male presenting recently with scrotal swelling bilaterally. The patient did have transluminal bowel cystic masses and palpable testicles bilaterally. I did not feel at the time that the patient did have inguinal hernias but hydroceles. He was having significant discomfort because of this. He requested repair. I discussed the procedure as well as postoperative risk with the patient. These include but are not limited to infection, bleeding, recurrence of hydrocele, anesthetic complications, among others. Patient agrees/understands and desires to proceed.  Description of procedure: The patient was properly identified in the holding area. Was taken to the operating room where general anesthetic was administered. Preoperative IV antibiotics were administered. Perineum lower abdomen and genitalia were prepped and draped. Proper timeout was performed. A 5 cm incision was made in the anterior median raphae and carried down to the tunica vaginalis bilaterally. I then dissected around the tunica. After delivering what I felt it first was the hydrocele on the left, I realized that this was a fat-containing inguinal hernia on the left. Similar situation noted on the right. There was a small hydrocele present but this accounted for basically none of the patient's scrotal mass. At this point I realized that hernia repair was necessary and not hydrocelectomy. General surgery was consulted. At this point, I inspected the surgical site and small bleeders were electrocoagulated. I then closed the dartos fascia with a running  suture of 3-0 chromic. Skin edges were then reapproximated using 4-0 Monocryl placed in a running simple fashion. I then placed Dermabond.  This point, Dr. Ezzard Standing was present in the surgical suite. He performed examination and we discussed hernia repair The patient was then consented through his wife for the procedure of bilateral inguinal hernia repair. This was performed by Dr. Ezzard Standing, dictated separately. I did assist with this procedure.

## 2019-07-18 NOTE — Anesthesia Postprocedure Evaluation (Signed)
Anesthesia Post Note  Patient: Bruce Morgan  Procedure(s) Performed: SCROTAL EXPLORATION (Bilateral Scrotum) OPEN BILATERAL INGUINAL HERNIA REPAIR WITH MESH (Bilateral Groin)     Patient location during evaluation: PACU Anesthesia Type: General Level of consciousness: awake and alert and oriented Pain management: pain level controlled Vital Signs Assessment: post-procedure vital signs reviewed and stable Respiratory status: spontaneous breathing, nonlabored ventilation and respiratory function stable Cardiovascular status: blood pressure returned to baseline Postop Assessment: no apparent nausea or vomiting Anesthetic complications: no    Last Vitals:  Vitals:   07/18/19 1830 07/18/19 1845  BP: 125/75 134/76  Pulse: 62 (!) 58  Resp: 16 18  Temp:    SpO2: 94% 97%    Last Pain:  Vitals:   07/18/19 1845  PainSc: 0-No pain                 Kaylyn Layer

## 2019-07-19 DIAGNOSIS — K4020 Bilateral inguinal hernia, without obstruction or gangrene, not specified as recurrent: Secondary | ICD-10-CM | POA: Diagnosis not present

## 2019-07-19 MED ORDER — HYDROCODONE-ACETAMINOPHEN 5-325 MG PO TABS
ORAL_TABLET | ORAL | Status: AC
  Filled 2019-07-19: qty 2

## 2019-07-19 MED ORDER — HYDROCODONE-ACETAMINOPHEN 5-325 MG PO TABS: 1.0000 | ORAL_TABLET | Freq: Four times a day (QID) | ORAL | 0 refills | Status: AC | PRN

## 2019-07-19 NOTE — Discharge Instructions (Signed)
Open Hernia Repair, Adult, Care After These instructions give you information about caring for yourself after your procedure. Your doctor may also give you more specific instructions. If you have problems or questions, contact your doctor. Follow these instructions at home: Surgical cut (incision) care  Follow ins HOME CARE INSTRUCTIONS FOR SCROTAL PROCEDURES  Wound Care & Hygiene: You may apply an ice bag to the scrotum for the first 24 hours.  This may help decrease swelling and soreness.  You may have a dressing held in place by an athletic supporter.  You may remove the dressing in 24 hours and shower in 48 hours.  Continue to use the athletic supporter or tight briefs for at least a week. Activity: Rest today - not necessarily flat bed rest.  Just take it easy.  You should not do strenuous activities until your follow-up visit with your doctor.  You may resume light activity in 48 hours.  Return to Work:  Your doctor will advise you of this depending on the type of work you do  Diet: Drink liquids or eat a light diet this evening.  You may resume a regular diet tomorrow.  General Expectations: You may have a small amount of bleeding.  The scrotum may be swollen or bruised for about a week.  Call your Doctor if these occur:  -persistent or heavy bleeding  -temperature of 101 degrees or more  -severe pain, not relieved by your pain medication  Return to [place]:  Call to set up and appointment.  Patient Signature:  __________________________________________________  Nurse's Signature:  __________________________________________________  tructions from your doctor about how to take care of your surgical cut area. Make sure you: ? Wash your hands with soap and water before you change your bandage (dressing). If you cannot use soap and water, use hand sanitizer. ? Change your bandage as told by your doctor. ? Leave stitches (sutures), skin glue, or skin tape (adhesive)  strips in place. They may need to stay in place for 2 weeks or longer. If tape strips get loose and curl up, you may trim the loose edges. Do not remove tape strips completely unless your doctor says it is okay.  Check your surgical cut every day for signs of infection. Check for: ? More redness, swelling, or pain. ? More fluid or blood. ? Warmth. ? Pus or a bad smell. Activity  Do not drive or use heavy machinery while taking prescription pain medicine. Do not drive until your doctor says it is okay.  Until your doctor says it is okay: ? Do not lift anything that is heavier than 10 lb (4.5 kg). ? Do not play contact sports.  Return to your normal activities as told by your doctor. Ask your doctor what activities are safe. General instructions  To prevent or treat having a hard time pooping (constipation) while you are taking prescription pain medicine, your doctor may recommend that you: ? Drink enough fluid to keep your pee (urine) clear or pale yellow. ? Take over-the-counter or prescription medicines. ? Eat foods that are high in fiber, such as fresh fruits and vegetables, whole grains, and beans. ? Limit foods that are high in fat and processed sugars, such as fried and sweet foods.  Take over-the-counter and prescription medicines only as told by your doctor.  Do not take baths, swim, or use a hot tub until your doctor says it is okay.  Keep all follow-up visits as told by your doctor. This is important.  Contact a doctor if:  You develop a rash.  You have more redness, swelling, or pain around your surgical cut.  You have more fluid or blood coming from your surgical cut.  Your surgical cut feels warm to the touch.  You have pus or a bad smell coming from your surgical cut.  You have a fever or chills.  You have blood in your poop (stool).  You have not pooped in 2-3 days.  Medicine does not help your pain. Get help right away if:  You have chest pain or you  are short of breath.  You feel light-headed.  You feel weak and dizzy (feel faint).  You have very bad pain.  You throw up (vomit) and your pain is worse. This information is not intended to replace advice given to you by your health care provider. Make sure you discuss any questions you have with your health care provider. Document Revised: 06/01/2018 Document Reviewed: 07/22/2015 Elsevier Patient Education  2020 [place] TO PATIENT  Activity:  Driving - May drive in 3 to 5 days, if doing well   Lifting - No lifting more than 15 pounds for 4 weeks                       Practice your Covid-19 protection:  Wear a mask, social distance, and wash your hands frequently  Wound Care:   Leave the incision dry for 2 days, then you may shower  Diet:  As tolerated  Follow up appointment:  Call Dr. Pollie Friar office [facility]) at [phone] for an appointment in 2 to 3 weeks..  Medications and dosages:  Resume your home medications.  You have a prescription for:  Ultram  Call Dr. Lucia Gaskins or his office  [phone]) if you have:  Temperature greater than 100.4,  Persistent nausea and vomiting,  Severe uncontrolled pain,  Redness, tenderness, or signs of infection (pain, swelling, redness, odor or green/yellow discharge around the site),  Difficulty breathing, headache or visual disturbances,  Any other questions or concerns you may have after discharge.  In an emergency, call 911 or go to an Emergency Department at a nearby hospital.

## 2019-08-03 NOTE — Discharge Summary (Signed)
Patient ID: Bruce Morgan MRN: [id number] DOB/AGE: [id number] y.o.  Admit date: 07/18/2019 Discharge date: 08/03/2019  Primary Care Physician:  Janece Canterbury, MD  Discharge Diagnoses: Bilateral inguinal hernias Present on Admission: . Hernia, inguinal, bilateral   Consults: General surgery, Dr. Ovidio Kin   Discharge Medications: Allergies as of 07/19/2019   No Known Allergies     Medication List    TAKE these medications   diltiazem 240 MG 24 hr capsule Commonly known as: CARDIZEM CD Take 240 mg by mouth daily.   doxazosin 8 MG tablet Commonly known as: CARDURA Take 8 mg by mouth at bedtime.   HYDROcodone-acetaminophen 5-325 MG tablet Commonly known as: NORCO/VICODIN Take 1 tablet by mouth every 6 (six) hours as needed for moderate pain. Notes to patient: Next dose is due 2 pm as needed for pain.   metoprolol succinate 100 MG 24 hr tablet Commonly known as: TOPROL-XL Take 100 mg by mouth daily. Take with or immediately following a meal.   rosuvastatin 5 MG tablet Commonly known as: CRESTOR Take 5 mg by mouth at bedtime.   VITAMIN B-12 PO Take 1 tablet by mouth daily.        Significant Diagnostic Studies:  No results found.  Brief H and P: For complete details please refer to admission H and P, but in brief the patient was admitted for surgical management of bilateral hydroceles that intraoperatively were found to be bilateral inguinal hernias.  Hospital Course: The patient underwent scrotal exploration which revealed that his hydroceles were actually inguinal hernias.  General surgical consultation was then provided by Dr. Ovidio Kin and bilateral inguinal hernia repair with mesh was performed.  The patient was admitted overnight for observation and went home postoperative morning 1. Active Problems:   Hernia, inguinal, bilateral   Day of Discharge BP 114/69 (BP Location: Left Arm)   Pulse 69   Temp 98.3 F (36.8 C)   Resp 16   Ht 5\' 11"  (1.803  m)   Wt 113.4 kg   SpO2 92%   BMI 34.87 kg/m   No results found for this or any previous visit (from the past 24 hour(s)).  Physical Exam: General: Alert and awake oriented x3 not in any acute distress. HEENT: anicteric sclera, pupils reactive to light and accommodation CVS: S1-S2 clear no murmur rubs or gallops Chest: clear to auscultation bilaterally, no wheezing rales or rhonchi Abdomen: soft nontender, nondistended, normal bowel sounds, no organomegaly Extremities: no cyanosis, clubbing or edema noted bilaterally Neuro: Cranial nerves II-XII intact, no focal neurological deficits  Disposition: Home  Diet: No restrictions  Activity: Restrictions discussed with patient   Disposition and Follow-up:  Discharge Instructions    Diet - low sodium heart healthy   Complete by: As directed    Increase activity slowly   Complete by: As directed          TESTS THAT NEED FOLLOW-UP  N/A  DISCHARGE FOLLOW-UP    Time spent on Discharge:  15 minutes  Signed: [name] 08/03/2019, 12:11 PM
